# Patient Record
Sex: Male | Born: 1960 | Race: Black or African American | Hispanic: No | Smoking: Never smoker
Health system: Southern US, Community
[De-identification: ages and names within clinical notes are randomized; demographics above are authoritative.]

## PROBLEM LIST (undated history)

## (undated) DIAGNOSIS — I10 Essential (primary) hypertension: Secondary | ICD-10-CM

## (undated) DIAGNOSIS — F32A Depression, unspecified: Secondary | ICD-10-CM

## (undated) DIAGNOSIS — F329 Major depressive disorder, single episode, unspecified: Secondary | ICD-10-CM

## (undated) DIAGNOSIS — G4730 Sleep apnea, unspecified: Secondary | ICD-10-CM

## (undated) HISTORY — DX: Essential (primary) hypertension: I10

## (undated) HISTORY — DX: Depression, unspecified: F32.A

## (undated) HISTORY — DX: Major depressive disorder, single episode, unspecified: F32.9

---

## 1998-11-24 ENCOUNTER — Encounter: Payer: Self-pay | Admitting: Internal Medicine

## 1998-11-24 ENCOUNTER — Ambulatory Visit (HOSPITAL_COMMUNITY): Admission: RE | Admit: 1998-11-24 | Discharge: 1998-11-24 | Payer: Self-pay | Admitting: Internal Medicine

## 2001-12-08 ENCOUNTER — Emergency Department (HOSPITAL_COMMUNITY): Admission: EM | Admit: 2001-12-08 | Discharge: 2001-12-09 | Payer: Self-pay | Admitting: Emergency Medicine

## 2001-12-09 ENCOUNTER — Encounter: Payer: Self-pay | Admitting: Emergency Medicine

## 2004-03-26 ENCOUNTER — Emergency Department (HOSPITAL_COMMUNITY): Admission: EM | Admit: 2004-03-26 | Discharge: 2004-03-26 | Payer: Self-pay | Admitting: Emergency Medicine

## 2006-03-23 ENCOUNTER — Emergency Department (HOSPITAL_COMMUNITY): Admission: EM | Admit: 2006-03-23 | Discharge: 2006-03-23 | Payer: Self-pay | Admitting: Emergency Medicine

## 2009-12-20 ENCOUNTER — Emergency Department (HOSPITAL_COMMUNITY): Admission: EM | Admit: 2009-12-20 | Discharge: 2009-12-20 | Payer: Self-pay | Admitting: Emergency Medicine

## 2011-05-06 DIAGNOSIS — N529 Male erectile dysfunction, unspecified: Secondary | ICD-10-CM | POA: Diagnosis not present

## 2011-05-06 DIAGNOSIS — I10 Essential (primary) hypertension: Secondary | ICD-10-CM | POA: Diagnosis not present

## 2011-05-06 DIAGNOSIS — Z8249 Family history of ischemic heart disease and other diseases of the circulatory system: Secondary | ICD-10-CM | POA: Diagnosis not present

## 2011-05-06 DIAGNOSIS — Z79899 Other long term (current) drug therapy: Secondary | ICD-10-CM | POA: Diagnosis not present

## 2013-05-13 DIAGNOSIS — E559 Vitamin D deficiency, unspecified: Secondary | ICD-10-CM | POA: Diagnosis not present

## 2013-05-13 DIAGNOSIS — I10 Essential (primary) hypertension: Secondary | ICD-10-CM | POA: Diagnosis not present

## 2013-05-13 DIAGNOSIS — Z125 Encounter for screening for malignant neoplasm of prostate: Secondary | ICD-10-CM | POA: Diagnosis not present

## 2013-05-13 DIAGNOSIS — Z8249 Family history of ischemic heart disease and other diseases of the circulatory system: Secondary | ICD-10-CM | POA: Diagnosis not present

## 2013-05-13 DIAGNOSIS — N529 Male erectile dysfunction, unspecified: Secondary | ICD-10-CM | POA: Diagnosis not present

## 2013-05-13 DIAGNOSIS — Z0000 Encounter for general adult medical examination without abnormal findings: Secondary | ICD-10-CM | POA: Diagnosis not present

## 2013-05-13 DIAGNOSIS — R079 Chest pain, unspecified: Secondary | ICD-10-CM | POA: Diagnosis not present

## 2013-05-27 DIAGNOSIS — R0789 Other chest pain: Secondary | ICD-10-CM | POA: Diagnosis not present

## 2013-06-30 DIAGNOSIS — Z1211 Encounter for screening for malignant neoplasm of colon: Secondary | ICD-10-CM | POA: Diagnosis not present

## 2013-06-30 DIAGNOSIS — Z800 Family history of malignant neoplasm of digestive organs: Secondary | ICD-10-CM | POA: Diagnosis not present

## 2013-06-30 DIAGNOSIS — R079 Chest pain, unspecified: Secondary | ICD-10-CM | POA: Diagnosis not present

## 2013-07-07 DIAGNOSIS — I10 Essential (primary) hypertension: Secondary | ICD-10-CM | POA: Diagnosis not present

## 2013-07-07 DIAGNOSIS — R079 Chest pain, unspecified: Secondary | ICD-10-CM | POA: Diagnosis not present

## 2013-07-12 DIAGNOSIS — I10 Essential (primary) hypertension: Secondary | ICD-10-CM | POA: Diagnosis not present

## 2013-07-12 DIAGNOSIS — R079 Chest pain, unspecified: Secondary | ICD-10-CM | POA: Diagnosis not present

## 2013-08-05 DIAGNOSIS — R079 Chest pain, unspecified: Secondary | ICD-10-CM | POA: Diagnosis not present

## 2013-08-05 DIAGNOSIS — I10 Essential (primary) hypertension: Secondary | ICD-10-CM | POA: Diagnosis not present

## 2013-08-17 DIAGNOSIS — I10 Essential (primary) hypertension: Secondary | ICD-10-CM | POA: Diagnosis not present

## 2013-08-17 DIAGNOSIS — R079 Chest pain, unspecified: Secondary | ICD-10-CM | POA: Diagnosis not present

## 2013-09-08 ENCOUNTER — Ambulatory Visit (INDEPENDENT_AMBULATORY_CARE_PROVIDER_SITE_OTHER): Payer: Medicare Other | Admitting: Emergency Medicine

## 2013-09-08 VITALS — BP 128/92 | HR 91 | Temp 97.9°F | Resp 16 | Ht 68.0 in | Wt 173.4 lb

## 2013-09-08 DIAGNOSIS — H00019 Hordeolum externum unspecified eye, unspecified eyelid: Secondary | ICD-10-CM

## 2013-09-08 DIAGNOSIS — H00016 Hordeolum externum left eye, unspecified eyelid: Secondary | ICD-10-CM

## 2013-09-08 MED ORDER — TOBRAMYCIN 0.3 % OP OINT: 1.0000 "application " | TOPICAL_OINTMENT | Freq: Three times a day (TID) | OPHTHALMIC | Status: DC

## 2013-09-08 NOTE — Progress Notes (Signed)
   Subjective:    Patient ID: Allen Cole, male    DOB: 1960-03-17, 53 y.o.   MRN: [id number]  HPI 53 year old male presents to [facility] with a bump on left eyelid Left eye stye x 1 week No pain, the area on the upper lid appears pustule. He has a history of not like areas of the upper lid   Review of Systems     Objective:   Physical Exam tear 2 small pustules present on the midportion of the upper lid. A Q-tip was used and these were opened and. The material was expressed from the pustules. The eye itself appears normal.        Assessment & Plan:  Stye. We'll treat with warm compresses and antibiotic ointment

## 2013-09-08 NOTE — Patient Instructions (Signed)
Sty A sty (hordeolum) is an infection of a gland in the eyelid located at the base of the eyelash. A sty may develop a white or yellow head of pus. It can be puffy (swollen). Usually, the sty will burst and pus will come out on its own. They do not leave lumps in the eyelid once they drain. A sty is often confused with another form of cyst of the eyelid called a chalazion. Chalazions occur within the eyelid and not on the edge where the bases of the eyelashes are. They often are red, sore and then form firm lumps in the eyelid. CAUSES   Germs (bacteria).  Lasting (chronic) eyelid inflammation. SYMPTOMS   Tenderness, redness and swelling along the edge of the eyelid at the base of the eyelashes.  Sometimes, there is a white or yellow head of pus. It may or may not drain. DIAGNOSIS  An ophthalmologist will be able to distinguish between a sty and a chalazion and treat the condition appropriately.  TREATMENT   Styes are typically treated with warm packs (compresses) until drainage occurs.  In rare cases, medicines that kill germs (antibiotics) may be prescribed. These antibiotics may be in the form of drops, cream or pills.  If a hard lump has formed, it is generally necessary to do a small incision and remove the hardened contents of the cyst in a minor surgical procedure done in the office.  In suspicious cases, your caregiver may send the contents of the cyst to the lab to be certain that it is not a rare, but dangerous form of cancer of the glands of the eyelid. HOME CARE INSTRUCTIONS   Wash your hands often and dry them with a clean towel. Avoid touching your eyelid. This may spread the infection to other parts of the eye.  Apply heat to your eyelid for 10 to 20 minutes, several times a day, to ease pain and help to heal it faster.  Do not squeeze the sty. Allow it to drain on its own. Wash your eyelid carefully 3 to 4 times per day to remove any pus. SEEK IMMEDIATE MEDICAL CARE IF:    Your eye becomes painful or puffy (swollen).  Your vision changes.  Your sty does not drain by itself within 3 days.  Your sty comes back within a short period of time, even with treatment.  You have redness (inflammation) around the eye.  You have a fever. Document Released: 12/05/2004 Document Revised: 05/20/2011 Document Reviewed: 08/09/2008 [facility] Patient Information 2015 [place], [place]. This information is not intended to replace advice given to you by your health care provider. Make sure you discuss any questions you have with your health care provider.

## 2013-09-13 DIAGNOSIS — I10 Essential (primary) hypertension: Secondary | ICD-10-CM | POA: Diagnosis not present

## 2013-09-13 DIAGNOSIS — Z79899 Other long term (current) drug therapy: Secondary | ICD-10-CM | POA: Diagnosis not present

## 2013-10-05 DIAGNOSIS — K5730 Diverticulosis of large intestine without perforation or abscess without bleeding: Secondary | ICD-10-CM | POA: Diagnosis not present

## 2013-10-05 DIAGNOSIS — Z1211 Encounter for screening for malignant neoplasm of colon: Secondary | ICD-10-CM | POA: Diagnosis not present

## 2013-10-05 DIAGNOSIS — D126 Benign neoplasm of colon, unspecified: Secondary | ICD-10-CM | POA: Diagnosis not present

## 2013-10-23 ENCOUNTER — Ambulatory Visit (INDEPENDENT_AMBULATORY_CARE_PROVIDER_SITE_OTHER): Payer: Medicare Other

## 2013-10-23 ENCOUNTER — Ambulatory Visit (INDEPENDENT_AMBULATORY_CARE_PROVIDER_SITE_OTHER): Payer: Medicare Other | Admitting: Emergency Medicine

## 2013-10-23 VITALS — BP 164/94 | HR 79 | Temp 97.4°F | Resp 16 | Ht 68.5 in | Wt 177.4 lb

## 2013-10-23 DIAGNOSIS — M79609 Pain in unspecified limb: Secondary | ICD-10-CM

## 2013-10-23 DIAGNOSIS — M79642 Pain in left hand: Secondary | ICD-10-CM

## 2013-10-23 NOTE — Progress Notes (Addendum)
   Subjective:   This chart was scribed for Allen Cole A. Allen Farrier, MD  by Forrestine Him, [facility] Scribe. This patient was seen in room 4 and the patient's care was started 3:39 PM.     Patient ID: Allen Cole, male    DOB: October 27, 1960, 53 y.o.   MRN: [id number]  HPI  HPI Comments: [name] is a 53 y.o. male with a PMHx of HTN who presents to [facility] complaining of constant, moderate L hand pain x 1 week that is unchanged at this time. Pt states he noted the discomfort after falling back on his L hand while on a boat. Pt states currently discomfort is exacerbated with certain movement of the hand. No alleviating factors at this time. He is unable to fully flex or extend his L 3rd, 4th, and 5th fingers. He denies any previous injury to the hand. At this time he denies any fever or chills. No numbness, loss of sensation, or paresthesia. No known allergies to medications. No other concerns this visit.  There are no active problems to display for this patient.  Past Medical History  Diagnosis Date  . Depression   . Hypertension    History reviewed. No pertinent past surgical history. No Known Allergies Prior to Admission medications   Medication Sig Start Date End Date Taking? Authorizing Provider  ibuprofen (ADVIL,MOTRIN) 200 MG tablet Take 200 mg by mouth as needed.   Yes Historical Provider, MD  olmesartan-hydrochlorothiazide (BENICAR HCT) 20-12.5 MG per tablet Take 1 tablet by mouth daily.   Yes Historical Provider, MD    Review of Systems  Constitutional: Negative for fever and chills.  Musculoskeletal: Positive for arthralgias and joint swelling.    Triage Vitals: BP 164/94  Pulse 79  Temp(Src) 97.4 F (36.3 C) (Oral)  Resp 16  Ht 5' 8.5" (1.74 m)  Wt 177 lb 6.4 oz (80.468 kg)  BMI 26.58 kg/m2  SpO2 97%   Objective:  Physical Exam  Nursing note and vitals reviewed. Constitutional: He is oriented to person, place, and  time. He appears well-developed and well-nourished.  HENT:  Head: Normocephalic.  Eyes: EOM are normal.  Neck: Normal range of motion.  Pulmonary/Chest: Effort normal.  Abdominal: He exhibits no distension.  Musculoskeletal: Normal range of motion. He exhibits edema and tenderness.  Tenderness to palpation over distal 4th metacarpal Swelling over the 3rd, 4th, and 5th metacarpals Not able to fully flex 3rd, 4th, and 5th fingers  Neurological: He is alert and oriented to person, place, and time.  Psychiatric: He has a normal mood and affect.  [facility] reading (PRIMARY) by  Dr.Piedad Standiford no definite fracture seen.   Assessment & Plan:  Radiologist also did not see a fracture. We'll treat with buddy taping of the third and fourth fingers together. I advised the patient if he continued to have symptoms to recheck in one week. He will keep the third  finger splinted to the fourth and I personally performed the services described in this documentation, which was scribed in my presence. The recorded information has been reviewed and is accurate.

## 2013-12-21 ENCOUNTER — Telehealth: Payer: Self-pay

## 2013-12-21 NOTE — Telephone Encounter (Signed)
Request printed and forwarded to x-ray. °

## 2013-12-21 NOTE — Telephone Encounter (Signed)
Patient called requesting x-rays of left hand for upcoming ortho appt on Thursday. Please call when ready at [phone]

## 2013-12-24 DIAGNOSIS — S6392XA Sprain of unspecified part of left wrist and hand, initial encounter: Secondary | ICD-10-CM | POA: Diagnosis not present

## 2013-12-30 DIAGNOSIS — M19042 Primary osteoarthritis, left hand: Secondary | ICD-10-CM | POA: Diagnosis not present

## 2014-06-13 ENCOUNTER — Ambulatory Visit (INDEPENDENT_AMBULATORY_CARE_PROVIDER_SITE_OTHER): Payer: Medicare Other | Admitting: Emergency Medicine

## 2014-06-13 ENCOUNTER — Ambulatory Visit (INDEPENDENT_AMBULATORY_CARE_PROVIDER_SITE_OTHER): Payer: Medicare Other

## 2014-06-13 VITALS — BP 154/86 | HR 105 | Temp 98.1°F | Resp 16 | Ht 68.0 in | Wt 171.0 lb

## 2014-06-13 DIAGNOSIS — J209 Acute bronchitis, unspecified: Secondary | ICD-10-CM

## 2014-06-13 DIAGNOSIS — R05 Cough: Secondary | ICD-10-CM | POA: Diagnosis not present

## 2014-06-13 DIAGNOSIS — J0140 Acute pansinusitis, unspecified: Secondary | ICD-10-CM | POA: Diagnosis not present

## 2014-06-13 DIAGNOSIS — R059 Cough, unspecified: Secondary | ICD-10-CM

## 2014-06-13 MED ORDER — PSEUDOEPHEDRINE-GUAIFENESIN ER 60-600 MG PO TB12: 1.0000 | ORAL_TABLET | Freq: Two times a day (BID) | ORAL | Status: AC

## 2014-06-13 MED ORDER — AMOXICILLIN-POT CLAVULANATE 875-125 MG PO TABS: 1.0000 | ORAL_TABLET | Freq: Two times a day (BID) | ORAL | Status: DC

## 2014-06-13 MED ORDER — HYDROCOD POLST-CHLORPHEN POLST 10-8 MG/5ML PO LQCR: 5.0000 mL | Freq: Two times a day (BID) | ORAL | Status: DC | PRN

## 2014-06-13 NOTE — Patient Instructions (Signed)

## 2014-06-13 NOTE — Progress Notes (Signed)
[facility] [address] [place] [address] [phone]  Date:  06/13/2014   Name:  Allen Cole   DOB:  06-28-1960   MRN:  [id number]  PCP:  No PCP Per Patient    Chief Complaint: Cough   History of Present Illness:  Allen Cole is a 54 y.o. very pleasant male patient who presents with the following:  Ill for two weeks,  Initially with nasal congestion and drainage.   Cough productive of purulent sputum.  Now blood tinged Non smoker No wheezing or shortness of breath No fever or chills. No malaise or fatigue Retired Non smoker No improvement with over the counter medications or other home remedies. Denies other complaint or health concern today.   There are no active problems to display for this patient.   Past Medical History  Diagnosis Date  . Depression   . Hypertension     History reviewed. No pertinent past surgical history.  History  Substance Use Topics  . Smoking status: Never Smoker   . Smokeless tobacco: Not on file  . Alcohol Use: Yes    Family History  Problem Relation Age of Onset  . Diabetes Mother   . Diabetes Maternal Grandmother   . Hyperlipidemia Maternal Grandfather   . Stroke Maternal Grandfather     No Known Allergies  Medication list has been reviewed and updated.  Current Outpatient Prescriptions on File Prior to Visit  Medication Sig Dispense Refill  . ibuprofen (ADVIL,MOTRIN) 200 MG tablet Take 200 mg by mouth as needed.    Marland Kitchen olmesartan-hydrochlorothiazide (BENICAR HCT) 20-12.5 MG per tablet Take 1 tablet by mouth daily.     No current facility-administered medications on file prior to visit.    Review of Systems:  As per HPI, otherwise negative.    Physical Examination: Filed Vitals:   06/13/14 1553  BP: 154/86  Pulse: 105  Temp: 98.1 F (36.7 C)  Resp: 16   Filed Vitals:   06/13/14 1553  Height: 5\' 8"  (1.727 m)  Weight: 171 lb (77.565 kg)   Body mass index is 26.01  kg/(m^2). Ideal Body Weight: Weight in (lb) to have BMI = 25: 164.1  GEN: WDWN, NAD, Non-toxic, A & O x 3 HEENT: Atraumatic, Normocephalic. Neck supple. No masses, No LAD. Ears and Nose: No external deformity. CV: RRR, No M/G/R. No JVD. No thrill. No extra heart sounds. PULM: CTA B, no wheezes, crackles, rhonchi. No retractions. No resp. distress. No accessory muscle use. ABD: S, NT, ND, +BS. No rebound. No HSM. EXTR: No c/c/e NEURO Normal gait.  PSYCH: Normally interactive. Conversant. Not depressed or anxious appearing.  Calm demeanor.    Assessment and Plan: Bronchitis Sinusitis augmentin mucinex tusionex  Signed,  Ellison Carwin, MD   [facility] reading (PRIMARY) by  Dr. Ouida Sills.  Negative .

## 2015-10-09 ENCOUNTER — Emergency Department (HOSPITAL_COMMUNITY): Payer: Medicare Other

## 2015-10-09 ENCOUNTER — Encounter (HOSPITAL_COMMUNITY): Payer: Self-pay | Admitting: Emergency Medicine

## 2015-10-09 ENCOUNTER — Emergency Department (HOSPITAL_COMMUNITY)
Admission: EM | Admit: 2015-10-09 | Discharge: 2015-10-10 | Disposition: A | Discharge status: Home / Self Care | Payer: Medicare Other | Attending: Emergency Medicine | Admitting: Emergency Medicine

## 2015-10-09 DIAGNOSIS — Z791 Long term (current) use of non-steroidal anti-inflammatories (NSAID): Secondary | ICD-10-CM | POA: Diagnosis not present

## 2015-10-09 DIAGNOSIS — I10 Essential (primary) hypertension: Secondary | ICD-10-CM | POA: Insufficient documentation

## 2015-10-09 DIAGNOSIS — R079 Chest pain, unspecified: Secondary | ICD-10-CM

## 2015-10-09 DIAGNOSIS — R0789 Other chest pain: Secondary | ICD-10-CM | POA: Insufficient documentation

## 2015-10-09 DIAGNOSIS — M25512 Pain in left shoulder: Secondary | ICD-10-CM | POA: Insufficient documentation

## 2015-10-09 LAB — CBC
HEMATOCRIT: 39 % (ref 39.0–52.0)
Hemoglobin: 13.6 g/dL (ref 13.0–17.0)
MCH: 30.9 pg (ref 26.0–34.0)
MCHC: 34.9 g/dL (ref 30.0–36.0)
MCV: 88.6 fL (ref 78.0–100.0)
PLATELETS: 183 10*3/uL (ref 150–400)
RBC: 4.4 MIL/uL (ref 4.22–5.81)
RDW: 12.4 % (ref 11.5–15.5)
WBC: 4.8 10*3/uL (ref 4.0–10.5)

## 2015-10-09 LAB — BASIC METABOLIC PANEL
Anion gap: 8 (ref 5–15)
BUN: 8 mg/dL (ref 6–20)
CALCIUM: 9.1 mg/dL (ref 8.9–10.3)
CO2: 24 mmol/L (ref 22–32)
Chloride: 104 mmol/L (ref 101–111)
Creatinine, Ser: 0.78 mg/dL (ref 0.61–1.24)
GFR calc Af Amer: >60 mL/min (ref >60)
GLUCOSE: 121 mg/dL — AB (ref 65–99)
POTASSIUM: 3.5 mmol/L (ref 3.5–5.1)
SODIUM: 136 mmol/L (ref 135–145)

## 2015-10-09 LAB — I-STAT TROPONIN, ED: TROPONIN I, POC: 0 ng/mL (ref 0.00–0.08)

## 2015-10-09 LAB — TROPONIN I

## 2015-10-09 NOTE — ED Triage Notes (Signed)
Pt reports L shoulder/neck pain for the past 2 weeks that radiates into chest. No known injuries or neck problems. No SOB, dizziness or lightheadedness. Pain sometimes radiates down arm.

## 2015-10-09 NOTE — ED Provider Notes (Signed)
Emergency Department Provider Note   I have reviewed the triage vital signs and the nursing notes.   HISTORY  Chief Complaint Shoulder Pain and Chest Pain   HPI Allen Cole is a 55 y.o. male with PMH of depression and HTN presents to the emergency department for evaluation of left shoulder and chest discomfort with some discomfort down his left arm. The patient states that symptoms been ongoing for 2 weeks. He reports significantly worsening symptoms with turning his head to the left or lifting his arm. He feels an intense pain radiating from his left shoulder. He reports some occasional tingling in his fingertips but no obvious numbness or weakness. He occasionally has some left anterior chest pain. Patient reports undergoing Cardiolite stress test in the distant past for some chest pain but this feels different. He has tried Tylenol, Motrin without significant relief in symptoms and the patient is having difficulty sleeping.    Past Medical History:  Diagnosis Date  . Depression   . Hypertension     There are no active problems to display for this patient.   History reviewed. No pertinent surgical history.  Current Outpatient Rx  . Order #: [id number] Class: Historical Med  . Order #: [id number] Class: Historical Med  . Order #: [id number] Class: Normal  . Order #: [id number] Class: Print  . Order #: [id number] Class: Print  . Order #: [id number] Class: Historical Med  . Order #: [id number] Class: Print    Allergies Review of patient's allergies indicates no known allergies.  Family History  Problem Relation Age of Onset  . Diabetes Mother   . Diabetes Maternal Grandmother   . Hyperlipidemia Maternal Grandfather   . Stroke Maternal Grandfather     Social History Social History  Substance Use Topics  . Smoking status: Never Smoker  . Smokeless tobacco: Not on file  . Alcohol use Yes    Review of Systems  Constitutional: No fever/chills Eyes: No visual changes. ENT: No sore  throat. Cardiovascular: Positive chest pain. Respiratory: Denies shortness of breath. Gastrointestinal: No abdominal pain.  No nausea, no vomiting.  No diarrhea.  No constipation. Genitourinary: Negative for dysuria. Musculoskeletal: Negative for back pain. Positive left shoulder pain.  Skin: Negative for rash. Neurological: Negative for headaches, focal weakness or numbness.  10-point ROS otherwise negative.  ____________________________________________   PHYSICAL EXAM:  VITAL SIGNS: ED Triage Vitals  Enc Vitals Group     BP 10/09/15 1835 158/99     Pulse Rate 10/09/15 1835 90     Resp 10/09/15 1835 21     Temp 10/09/15 1835 97.9 F (36.6 C)     Temp Source 10/09/15 1835 Oral     SpO2 10/09/15 1835 96 %     Pain Score 10/09/15 1839 7   Constitutional: Alert and oriented. Well appearing and in no acute distress. Eyes: Conjunctivae are normal. PERRL. EOMI. Head: Atraumatic. Nose: No congestion/rhinnorhea. Mouth/Throat: Mucous membranes are moist.  Oropharynx non-erythematous. Neck: No stridor.  Cardiovascular: Normal rate, regular rhythm. Good peripheral circulation. Grossly normal heart sounds.   Respiratory: Normal respiratory effort.  No retractions. Lungs CTAB. Gastrointestinal: Soft and nontender. No distention.  Musculoskeletal: Tenderness to palpation along the trapezius and deltoid on the left. Full active and passive ROM of the left shoulder with some pain on this movement.  Neurologic:  Normal speech and language. No gross focal neurologic deficits are appreciated.  Skin:  Skin is warm, dry and intact. No rash noted. Psychiatric: Mood and affect are normal. Speech and behavior  are normal.  ____________________________________________   LABS (all labs ordered are listed, but only abnormal results are displayed)  Labs Reviewed  BASIC METABOLIC PANEL - Abnormal; Notable for the following:       Result Value   Glucose, Bld 121 (*)    All other components within  normal limits  CBC  TROPONIN I  I-STAT TROPOININ, ED  I-STAT TROPOININ, ED   ____________________________________________  EKG  Reviewed. No STEMI.  ____________________________________________  RADIOLOGY  Dg Chest 2 View  Result Date: 10/09/2015 CLINICAL DATA:  Severe chest pain and left shoulder pain. No history of injury. EXAM: CHEST  2 VIEW COMPARISON:  06/13/2014 FINDINGS: Normal heart size. Lungs clear. No pneumothorax. No pleural effusion. IMPRESSION: No active cardiopulmonary disease. Electronically Signed   By: Marybelle Killings M.D.   On: 10/09/2015 19:13   Dg Shoulder Left  Result Date: 10/09/2015 CLINICAL DATA:  Severe Chest pain and left shoulder pain without injury x 2 weeks. HTN controlled by meds no other cardiac hx EXAM: LEFT SHOULDER - 2+ VIEW COMPARISON:  None. FINDINGS: There is no evidence of fracture or dislocation. There is no evidence of arthropathy or other focal bone abnormality. Soft tissues are unremarkable. IMPRESSION: Negative. Electronically Signed   By: Nolon Nations M.D.   On: 10/09/2015 19:13    ____________________________________________   PROCEDURES  Procedure(s) performed:   Procedures  None ____________________________________________   INITIAL IMPRESSION / ASSESSMENT AND PLAN / ED COURSE  Pertinent labs & imaging results that were available during my care of the patient were reviewed by me and considered in my medical decision making (see chart for details).  Patient presents to the emergency department for evaluation of left shoulder, chest, arm pain. Patient describes it as very positional and movement dependent. He is able to reproduce the pain with turning his neck or lifting his arm. He reports he does have some discomfort with rest. His symptoms sound radicular in nature with the tingling in his left fingertips. No gross neurological deficits found on my exam. Patient has no tenderness to palpation of the cervical spine with full  range of motion there. No recent history of cervical spine injury. Plan for repeat troponin and pain control. Discussed that he will require outpatient management with his primary care physician to decide on further imaging of the spine or shoulder as deemed necessary at that time. I do not see an indication for emergent imaging at this time.   12:31 PM Second troponin is negative. Plan for discharge with PCP and Orthopedic follow up as needed. Gave Tramadol and Lidoderm patches for symptomatic relief.   At this time, I do not feel there is any life-threatening condition present. I have reviewed and discussed all results (EKG, imaging, lab, urine as appropriate), exam findings with patient. I have reviewed nursing notes and appropriate previous records.  I feel the patient is safe to be discharged home without further emergent workup. Discussed usual and customary return precautions. Patient and family (if present) verbalize understanding and are comfortable with this plan.  Patient will follow-up with their primary care provider. If they do not have a primary care provider, information for follow-up has been provided to them. All questions have been answered.  ____________________________________________  FINAL CLINICAL IMPRESSION(S) / ED DIAGNOSES  Final diagnoses:  Shoulder pain, acute, left  Chest pain, unspecified chest pain type     MEDICATIONS GIVEN DURING THIS VISIT:  Medications  traMADol (ULTRAM) tablet 50 mg (50 mg Oral Given 10/10/15 0137)  NEW OUTPATIENT MEDICATIONS STARTED DURING THIS VISIT:  Discharge Medication List as of 10/10/2015 12:31 AM    START taking these medications   Details  lidocaine (LIDODERM) 5 % Place 1 patch onto the skin daily. Remove & Discard patch within 12 hours or as directed by MD, Starting Tue 10/10/2015, Print    traMADol (ULTRAM) 50 MG tablet Take 1 tablet (50 mg total) by mouth every 6 (six) hours as needed., Starting Tue 10/10/2015, Print            Note:  This document was prepared using Dragon voice recognition software and may include unintentional dictation errors.  Nanda Quinton, MD Emergency Medicine   Margette Fast, MD 10/10/15 1130

## 2015-10-09 NOTE — ED Notes (Addendum)
Pt stated "the pain feels like cramping and then it becomes sharp.  It goes up into the left side of my neck & into my left shoulder blade  It depends on the way I turn."  Pt confirmed pain is reproducible.  Pt denies n/v/diaphoresis.  Pt also stated "I have been doing some work on a [employer]."  By pt's admission, does not take b/p meds as prescribed.  Pt placed on monitor.

## 2015-10-10 DIAGNOSIS — R0789 Other chest pain: Secondary | ICD-10-CM | POA: Diagnosis not present

## 2015-10-10 MED ORDER — TRAMADOL HCL 50 MG PO TABS: 50.0000 mg | ORAL_TABLET | Freq: Four times a day (QID) | ORAL | 0 refills | Status: DC | PRN

## 2015-10-10 MED ORDER — LIDOCAINE 5 % EX PTCH: 1.0000 | MEDICATED_PATCH | CUTANEOUS | 0 refills | Status: DC

## 2015-10-10 MED ORDER — TRAMADOL HCL 50 MG PO TABS
50.0000 mg | ORAL_TABLET | Freq: Once | ORAL | Status: AC
  Administered 2015-10-10: 50 mg via ORAL
  Filled 2015-10-10: qty 1

## 2015-10-10 NOTE — Discharge Instructions (Signed)

## 2015-10-20 DIAGNOSIS — M5412 Radiculopathy, cervical region: Secondary | ICD-10-CM | POA: Diagnosis not present

## 2015-10-26 DIAGNOSIS — M542 Cervicalgia: Secondary | ICD-10-CM | POA: Diagnosis not present

## 2015-10-30 DIAGNOSIS — M5412 Radiculopathy, cervical region: Secondary | ICD-10-CM | POA: Diagnosis not present

## 2015-11-02 DIAGNOSIS — M5412 Radiculopathy, cervical region: Secondary | ICD-10-CM | POA: Diagnosis not present

## 2015-11-02 DIAGNOSIS — M542 Cervicalgia: Secondary | ICD-10-CM | POA: Diagnosis not present

## 2015-11-06 DIAGNOSIS — M542 Cervicalgia: Secondary | ICD-10-CM | POA: Diagnosis not present

## 2015-11-06 DIAGNOSIS — M5412 Radiculopathy, cervical region: Secondary | ICD-10-CM | POA: Diagnosis not present

## 2015-11-08 DIAGNOSIS — M542 Cervicalgia: Secondary | ICD-10-CM | POA: Diagnosis not present

## 2015-11-08 DIAGNOSIS — M5412 Radiculopathy, cervical region: Secondary | ICD-10-CM | POA: Diagnosis not present

## 2015-11-16 DIAGNOSIS — M542 Cervicalgia: Secondary | ICD-10-CM | POA: Diagnosis not present

## 2015-11-16 DIAGNOSIS — M5412 Radiculopathy, cervical region: Secondary | ICD-10-CM | POA: Diagnosis not present

## 2015-11-17 DIAGNOSIS — M5412 Radiculopathy, cervical region: Secondary | ICD-10-CM | POA: Diagnosis not present

## 2015-11-17 DIAGNOSIS — M542 Cervicalgia: Secondary | ICD-10-CM | POA: Diagnosis not present

## 2015-11-20 DIAGNOSIS — M5412 Radiculopathy, cervical region: Secondary | ICD-10-CM | POA: Diagnosis not present

## 2015-11-20 DIAGNOSIS — M542 Cervicalgia: Secondary | ICD-10-CM | POA: Diagnosis not present

## 2015-11-28 DIAGNOSIS — M5412 Radiculopathy, cervical region: Secondary | ICD-10-CM | POA: Diagnosis not present

## 2015-12-18 DIAGNOSIS — M5412 Radiculopathy, cervical region: Secondary | ICD-10-CM | POA: Diagnosis not present

## 2016-03-14 ENCOUNTER — Ambulatory Visit (INDEPENDENT_AMBULATORY_CARE_PROVIDER_SITE_OTHER): Payer: Medicare Other | Admitting: Family Medicine

## 2016-03-14 ENCOUNTER — Ambulatory Visit: Payer: Medicare Other

## 2016-03-14 ENCOUNTER — Ambulatory Visit (INDEPENDENT_AMBULATORY_CARE_PROVIDER_SITE_OTHER): Payer: Medicare Other

## 2016-03-14 VITALS — BP 134/82 | HR 82 | Temp 97.9°F | Resp 18 | Ht 68.0 in | Wt 179.0 lb

## 2016-03-14 DIAGNOSIS — M25511 Pain in right shoulder: Secondary | ICD-10-CM | POA: Diagnosis not present

## 2016-03-14 DIAGNOSIS — S6991XA Unspecified injury of right wrist, hand and finger(s), initial encounter: Secondary | ICD-10-CM | POA: Diagnosis not present

## 2016-03-14 DIAGNOSIS — S4991XA Unspecified injury of right shoulder and upper arm, initial encounter: Secondary | ICD-10-CM | POA: Diagnosis not present

## 2016-03-14 DIAGNOSIS — M25531 Pain in right wrist: Secondary | ICD-10-CM | POA: Diagnosis not present

## 2016-03-14 DIAGNOSIS — S62111A Displaced fracture of triquetrum [cuneiform] bone, right wrist, initial encounter for closed fracture: Secondary | ICD-10-CM

## 2016-03-14 DIAGNOSIS — M25532 Pain in left wrist: Secondary | ICD-10-CM

## 2016-03-14 DIAGNOSIS — M7989 Other specified soft tissue disorders: Secondary | ICD-10-CM | POA: Diagnosis not present

## 2016-03-14 DIAGNOSIS — M25512 Pain in left shoulder: Secondary | ICD-10-CM

## 2016-03-14 MED ORDER — HYDROCODONE-ACETAMINOPHEN 5-325 MG PO TABS: 1.0000 | ORAL_TABLET | Freq: Four times a day (QID) | ORAL | 0 refills | Status: DC | PRN

## 2016-03-14 NOTE — Patient Instructions (Addendum)
Take the Norco for pain relief when you need it.  You can take this every 6 hours if so.    We are referring you to orthopedics for your shoulder and wrist.  They will remove the cast and reimage your wrist most likely.   If you have any questions, don't hesitate to reach out to [place].      IF you received an x-ray today, you will receive an invoice from [facility]. Please contact [facility] at [phone] with questions or concerns regarding your invoice.   IF you received labwork today, you will receive an invoice from [facility]. Please contact [facility] at [phone] with questions or concerns regarding your invoice.   Our billing staff will not be able to assist you with questions regarding bills from these companies.  You will be contacted with the lab results as soon as they are available. The fastest way to get your results is to activate your My Chart account. Instructions are located on the last page of this paperwork. If you have not heard from [place] regarding the results in 2 weeks, please contact this office.

## 2016-03-14 NOTE — Progress Notes (Signed)
Allen Cole is a 55 y.o. male who presents to [facility] today for fall:  1.  Fall:  Patient was out last night helping stuck cars in the ice when he slipped on the ice.  Landed on outstretched Right hand.  Had immediate pain in wrist and shoulder.  Pain has continued through today.  Did not go to work.  Has been taking Advil with minimal relief.  Unable to raise arm laterally or to front due to pain.  No numbness or tingling in arm or hand.  Eating and drinking well.  Did not hit head. No loss of consciousness. No headaches.  ROS as above.    PMH reviewed. Patient is a nonsmoker.   Past Medical History:  Diagnosis Date  . Depression   . Hypertension    History reviewed. No pertinent surgical history.  Medications reviewed. Current Outpatient Prescriptions  Medication Sig Dispense Refill  . olmesartan-hydrochlorothiazide (BENICAR HCT) 20-12.5 MG per tablet Take 1 tablet by mouth daily.    Marland Kitchen acetaminophen (TYLENOL) 500 MG tablet Take 1,000 mg by mouth every 6 (six) hours as needed for moderate pain.    Marland Kitchen amoxicillin-clavulanate (AUGMENTIN) 875-125 MG per tablet Take 1 tablet by mouth 2 (two) times daily. (Patient not taking: Reported on 03/14/2016) 20 tablet 0  . chlorpheniramine-HYDROcodone (TUSSIONEX PENNKINETIC ER) 10-8 MG/5ML LQCR Take 5 mLs by mouth every 12 (twelve) hours as needed. (Patient not taking: Reported on 03/14/2016) 60 mL 0  . ibuprofen (ADVIL,MOTRIN) 200 MG tablet Take 400 mg by mouth every 6 (six) hours as needed for moderate pain.     Marland Kitchen lidocaine (LIDODERM) 5 % Place 1 patch onto the skin daily. Remove & Discard patch within 12 hours or as directed by MD (Patient not taking: Reported on 03/14/2016) 30 patch 0  . traMADol (ULTRAM) 50 MG tablet Take 1 tablet (50 mg total) by mouth every 6 (six) hours as needed. (Patient not taking: Reported on 03/14/2016) 15 tablet 0   No current facility-administered medications for this visit.      Physical Exam:    BP 134/82 (BP Location: Right Arm, Patient Position: Sitting, Cuff Size: Normal)   Pulse 82   Temp 97.9 F (36.6 C) (Oral)   Resp 18   Ht 5\' 8"  (1.727 m)   Wt 179 lb (81.2 kg)   SpO2 93%   BMI 27.22 kg/m  Gen:  Alert, cooperative patient who appears stated age in no acute distress.  Vital signs reviewed. MSK:  Left shoulder/arm WNL.  Right shoulder:  Inability to actively raise his arm either forward or lateral abduction past about 25 secondary to pain. I was able to passively raise at about 45 before he was not able tolerate any further secondary to pain. This is both forward and laterally. Tender directly over his right anterior aspect of the shoulder in general region. Nontender posterior aspect of his right shoulder. Right wrist:  TTP directly over lateral aspect of dorsum of right wrist. Handgrip strength is 2 out of 5 and limited by pain As noted below he had full sensation throughout his hand. Skin: No bruising at hand or right shoulder CV:  Distal pulses intact BL +2 Neuro:  Sensation intact RIght hand all 5 fingers and throughout hand/arm in all nerve distributions.   Assessment and Plan:  1.  Right triquetrum fracture:  - volar splint placed here in clinic by PA - referral to a for this. He is artery a  patient at different orthopedics will call them in the morning. I have put a referral in as well.  #2. Right shoulder pain: -Seems most likely to be a possible rotator cuff tear. Especially as he is really unable to actively abduct his arm. -X-rays were negative for any dislocation. -As above follow-up with orthopedic. He was placed in a sling before leaving our clinic.

## 2016-03-18 ENCOUNTER — Ambulatory Visit (INDEPENDENT_AMBULATORY_CARE_PROVIDER_SITE_OTHER): Payer: Medicare Other | Admitting: Orthopaedic Surgery

## 2016-03-18 DIAGNOSIS — M25511 Pain in right shoulder: Secondary | ICD-10-CM | POA: Diagnosis not present

## 2016-03-18 DIAGNOSIS — M25531 Pain in right wrist: Secondary | ICD-10-CM | POA: Diagnosis not present

## 2016-04-03 DIAGNOSIS — M67911 Unspecified disorder of synovium and tendon, right shoulder: Secondary | ICD-10-CM | POA: Diagnosis not present

## 2016-04-08 DIAGNOSIS — M25511 Pain in right shoulder: Secondary | ICD-10-CM | POA: Diagnosis not present

## 2016-04-10 DIAGNOSIS — M75121 Complete rotator cuff tear or rupture of right shoulder, not specified as traumatic: Secondary | ICD-10-CM | POA: Diagnosis not present

## 2016-04-23 DIAGNOSIS — Y999 Unspecified external cause status: Secondary | ICD-10-CM | POA: Diagnosis not present

## 2016-04-23 DIAGNOSIS — S46011D Strain of muscle(s) and tendon(s) of the rotator cuff of right shoulder, subsequent encounter: Secondary | ICD-10-CM | POA: Diagnosis not present

## 2016-04-23 DIAGNOSIS — M24111 Other articular cartilage disorders, right shoulder: Secondary | ICD-10-CM | POA: Diagnosis not present

## 2016-04-23 DIAGNOSIS — M19011 Primary osteoarthritis, right shoulder: Secondary | ICD-10-CM | POA: Diagnosis not present

## 2016-04-23 DIAGNOSIS — M7541 Impingement syndrome of right shoulder: Secondary | ICD-10-CM | POA: Diagnosis not present

## 2016-04-23 DIAGNOSIS — G8918 Other acute postprocedural pain: Secondary | ICD-10-CM | POA: Diagnosis not present

## 2016-04-23 DIAGNOSIS — S46011A Strain of muscle(s) and tendon(s) of the rotator cuff of right shoulder, initial encounter: Secondary | ICD-10-CM | POA: Diagnosis not present

## 2016-04-23 DIAGNOSIS — M7542 Impingement syndrome of left shoulder: Secondary | ICD-10-CM | POA: Diagnosis not present

## 2016-05-01 DIAGNOSIS — M19011 Primary osteoarthritis, right shoulder: Secondary | ICD-10-CM | POA: Diagnosis not present

## 2016-05-06 DIAGNOSIS — M25511 Pain in right shoulder: Secondary | ICD-10-CM | POA: Diagnosis not present

## 2016-05-06 DIAGNOSIS — M75121 Complete rotator cuff tear or rupture of right shoulder, not specified as traumatic: Secondary | ICD-10-CM | POA: Diagnosis not present

## 2016-05-10 DIAGNOSIS — M25511 Pain in right shoulder: Secondary | ICD-10-CM | POA: Diagnosis not present

## 2016-05-10 DIAGNOSIS — M75121 Complete rotator cuff tear or rupture of right shoulder, not specified as traumatic: Secondary | ICD-10-CM | POA: Diagnosis not present

## 2016-05-14 DIAGNOSIS — M75121 Complete rotator cuff tear or rupture of right shoulder, not specified as traumatic: Secondary | ICD-10-CM | POA: Diagnosis not present

## 2016-05-14 DIAGNOSIS — M25511 Pain in right shoulder: Secondary | ICD-10-CM | POA: Diagnosis not present

## 2016-05-16 DIAGNOSIS — M75121 Complete rotator cuff tear or rupture of right shoulder, not specified as traumatic: Secondary | ICD-10-CM | POA: Diagnosis not present

## 2016-05-16 DIAGNOSIS — M25511 Pain in right shoulder: Secondary | ICD-10-CM | POA: Diagnosis not present

## 2016-05-23 ENCOUNTER — Ambulatory Visit (INDEPENDENT_AMBULATORY_CARE_PROVIDER_SITE_OTHER): Payer: Medicare Other | Admitting: Urgent Care

## 2016-05-23 VITALS — BP 132/90 | HR 87 | Temp 98.1°F | Resp 18 | Ht 68.0 in | Wt 178.8 lb

## 2016-05-23 DIAGNOSIS — M25511 Pain in right shoulder: Secondary | ICD-10-CM | POA: Diagnosis not present

## 2016-05-23 DIAGNOSIS — L03213 Periorbital cellulitis: Secondary | ICD-10-CM

## 2016-05-23 DIAGNOSIS — M75121 Complete rotator cuff tear or rupture of right shoulder, not specified as traumatic: Secondary | ICD-10-CM | POA: Diagnosis not present

## 2016-05-23 MED ORDER — CEFDINIR 300 MG PO CAPS: 600.0000 mg | ORAL_CAPSULE | Freq: Every day | ORAL | 0 refills | Status: AC

## 2016-05-23 NOTE — Progress Notes (Signed)
  MRN: [id number] DOB: 04/13/1960  Subjective:   Allen Cole is a 56 y.o. male presenting for chief complaint of Eye Problem (x3 weeks; painful and swollen; drains clear fluid off/on)  Reports 3 week history of right eye pain, swelling, irritation. Symptoms started out with itching, swelling (at its worst today). He has used warm compresses occasionally. Denies fever, eye pain with movement or light. Denies eye drainage, redness, matted eye. Denies fever, sinus congestion, sinus pain, headache, ear pain, ear drainage. He has previously had similar symptoms in the past for his left eye, resolved with tobramycin eye drops. Denies using contacts or glasses.  Allen Cole has a current medication list which includes the following prescription(s): olmesartan-hydrochlorothiazide and tramadol. Also has No Known Allergies.  Allen Cole  has a past medical history of Depression and Hypertension. Also reports right shoulder surgery.  Objective:   Vitals: BP 132/90   Pulse 87   Temp 98.1 F (36.7 C) (Oral)   Resp 18   Ht 5\' 8"  (1.727 m)   Wt 178 lb 12.8 oz (81.1 kg)   SpO2 96%   BMI 27.19 kg/m    Visual Acuity Screening   Right eye Left eye Both eyes  Without correction: 20/30 20/25 20/25   With correction:      Physical Exam  Constitutional: He is oriented to person, place, and time. He appears well-developed and well-nourished.  Eyes: EOM are normal. Pupils are equal, round, and reactive to light. Right eye exhibits no chemosis, no discharge, no exudate and no hordeolum. No foreign body present in the right eye. Left eye exhibits no discharge. Right conjunctiva is not injected. Right conjunctiva has no hemorrhage. Left conjunctiva is not injected. Left conjunctiva has no hemorrhage. No scleral icterus.    Cardiovascular: Normal rate.   Pulmonary/Chest: Effort normal.  Neurological: He is alert and oriented to person, place, and time.   Eye Exam: Eyelids everted and swept for foreign  body. The eye was stained with fluorescein. Examination under woods lamp does not reveal a foreign body or area of increased stain uptake. The eye was then irrigated copiously with saline.   Assessment and Plan :   1. Preseptal cellulitis of right upper eyelid - Will use cefdinir 300mg  twice daily. Use warm compresses. RTC in 2 days for recheck. Symptoms and physical exam findings reassuring against orbital cellulitis.  Jaynee Eagles, PA-C [facility] [phone] 05/23/2016  5:06 PM

## 2016-05-23 NOTE — Patient Instructions (Addendum)
Preseptal Cellulitis, Adult Preseptal cellulitis-also called periorbital cellulitis-is an infection that can affect your eyelid and the soft tissues or skin that surround your eye. The infection may also affect the structures that produce and drain your tears. It does not affect your eye itself. What are the causes? This condition may be caused by:  Bacterial infection.  Long-term (chronic) sinus infections.  An object (foreign body) that is stuck behind the eye.  An injury that:  Goes through the eyelid tissues.  Causes an infection, such as an insect sting.  Fracture of the bone around the eye.  Infections that have spread from the eyelid or other structures around the eye.  Bite wounds.  Inflammation or infection of the lining membranes of the brain (meningitis).  An infection in the blood (septicemia).  Dental infection (abscess).  Viral infection. This is rare. What increases the risk? Risk factors for preseptal cellulitis include:  Participating in activities that increase your risk of trauma to the face or head, such as boxing or high-speed activities.  Having a weakened defense system (immune system).  Medical conditions, such as nasal polyps, that increase your risk for frequent or recurrent sinus infections.  Not receiving regular dental care. What are the signs or symptoms? Symptoms of this condition usually come on suddenly. Symptoms may include:  Red, hot, and swollen eyelids.  Fever.  Difficulty opening your eye.  Eye pain. How is this diagnosed? This condition may be diagnosed by an eye exam. You may also have tests, such as:  Blood tests.  CT scan.  MRI.  Spinal tap (lumbar puncture). This is a procedure that involves removing and examining a small amount of the fluid that surrounds the brain and spinal cord. This checks for meningitis. How is this treated? Treatment for this condition will include antibiotic medicines. These may be given  by mouth (orally), through an IV, or as a shot. Your health care provider may also recommend nasal decongestants to reduce swelling. Follow these instructions at home:  Take your antibiotic medicine as directed by your health care provider. Finish all of it even if you start to feel better.  Take medicines only as directed by your health care provider.  Drink enough fluid to keep your urine clear or pale yellow.  Do not use any tobacco products, including cigarettes, chewing tobacco, or electronic cigarettes. If you need help quitting, ask your health care provider.  Keep all follow-up visits as directed by your health care provider. These include any visits with an eye specialist (ophthalmologist) or dentist. Contact a health care provider if:  You have a fever.  Your eyelids become more red, warm, or swollen.  You have new symptoms.  Your symptoms do not get better with treatment. Get help right away if:  You develop double vision, or your vision becomes blurred or worsens in any way.  You have trouble moving your eyes.  Your eye looks like it is sticking out or bulging out (proptosis).  You develop a severe headache, severe neck pain, or neck stiffness.  You develop repeated vomiting. This information is not intended to replace advice given to you by your health care provider. Make sure you discuss any questions you have with your health care provider. Document Released: 03/30/2010 Document Revised: 08/03/2015 Document Reviewed: 02/21/2014 Elsevier Interactive Patient Education  2017 [employer].     IF you received an x-ray today, you will receive an invoice from [facility]. Please contact [facility] at [phone] with  questions or concerns regarding your invoice.   IF you received labwork today, you will receive an invoice from [facility]. Please contact [facility] at [phone] with questions or concerns regarding your invoice.   Our billing  staff will not be able to assist you with questions regarding bills from these companies.  You will be contacted with the lab results as soon as they are available. The fastest way to get your results is to activate your My Chart account. Instructions are located on the last page of this paperwork. If you have not heard from [place] regarding the results in 2 weeks, please contact this office.

## 2016-05-25 ENCOUNTER — Ambulatory Visit (INDEPENDENT_AMBULATORY_CARE_PROVIDER_SITE_OTHER): Payer: Medicare Other | Admitting: Physician Assistant

## 2016-05-25 VITALS — BP 144/100 | HR 77 | Temp 97.8°F | Resp 16 | Ht 68.0 in | Wt 180.0 lb

## 2016-05-25 DIAGNOSIS — H00011 Hordeolum externum right upper eyelid: Secondary | ICD-10-CM | POA: Diagnosis not present

## 2016-05-25 MED ORDER — TOBRAMYCIN 0.3 % OP OINT: 1.0000 "application " | TOPICAL_OINTMENT | Freq: Three times a day (TID) | OPHTHALMIC | 0 refills | Status: AC

## 2016-05-25 NOTE — Patient Instructions (Addendum)
Warm compresses with gentle friction' No  Tear baby shampoo - wash out the eye    IF you received an x-ray today, you will receive an invoice from [facility]. Please contact [facility] at [phone] with questions or concerns regarding your invoice.   IF you received labwork today, you will receive an invoice from [facility]. Please contact [facility] at [phone] with questions or concerns regarding your invoice.   Our billing staff will not be able to assist you with questions regarding bills from these companies.  You will be contacted with the lab results as soon as they are available. The fastest way to get your results is to activate your My Chart account. Instructions are located on the last page of this paperwork. If you have not heard from [place] regarding the results in 2 weeks, please contact this office.    Stye A stye is a bump on your eyelid caused by a bacterial infection. A stye can form inside the eyelid (internal stye) or outside the eyelid (external stye). An internal stye may be caused by an infected oil-producing gland inside your eyelid. An external stye may be caused by an infection at the base of your eyelash (hair follicle). Styes are very common. Anyone can get them at any age. They usually occur in just one eye, but you may have more than one in either eye. What are the causes? The infection is almost always caused by bacteria called Staphylococcus aureus. This is a common type of bacteria that lives on your skin. What increases the risk? You may be at higher risk for a stye if you have had one before. You may also be at higher risk if you have:  Diabetes.  Long-term illness.  Long-term eye redness.  A skin condition called seborrhea.  High fat levels in your blood (lipids). What are the signs or symptoms? Eyelid pain is the most common symptom of a stye. Internal styes are more painful than external styes. Other signs and symptoms may  include:  Painful swelling of your eyelid.  A scratchy feeling in your eye.  Tearing and redness of your eye.  Pus draining from the stye. How is this diagnosed? Your health care provider may be able to diagnose a stye just by examining your eye. The health care provider may also check to make sure:  You do not have a fever or other signs of a more serious infection.  The infection has not spread to other parts of your eye or areas around your eye. How is this treated? Most styes will clear up in a few days without treatment. In some cases, you may need to use antibiotic drops or ointment to prevent infection. Your health care provider may have to drain the stye surgically if your stye is:  Large.  Causing a lot of pain.  Interfering with your vision. This can be done using a thin blade or a needle. Follow these instructions at home:  Take medicines only as directed by your health care provider.  Apply a clean, warm compress to your eye for 10 minutes, 4 times a day.  Do not wear contact lenses or eye makeup until your stye has healed.  Do not try to pop or drain the stye. Contact a health care provider if:  You have chills or a fever.  Your stye does not go away after several days.  Your stye affects your vision.  Your eyeball becomes swollen, red, or painful. This information is  not intended to replace advice given to you by your health care provider. Make sure you discuss any questions you have with your health care provider. Document Released: 12/05/2004 Document Revised: 10/22/2015 Document Reviewed: 06/11/2013 Elsevier Interactive Patient Education  2017 [employer].

## 2016-05-25 NOTE — Progress Notes (Signed)
   Allen Cole  MRN: [id number] DOB: 1960/12/12  PCP: Allen Cole., MD  Chief Complaint  Patient presents with  . Follow-up    Right eye infection, feeling a little better    Subjective:  Pt presents to clinic for eye recheck.  He has been using hot compresses - very itchy eyelid - slight decrease in swelling - painful for the last several weeks without injury to the area.  No eyeball pain or trouble with his vision.  Review of Systems  Eyes: Negative for pain, discharge, redness, itching and visual disturbance.    There are no active problems to display for this patient.   Current Outpatient Prescriptions on File Prior to Visit  Medication Sig Dispense Refill  . cefdinir (OMNICEF) 300 MG capsule Take 2 capsules (600 mg total) by mouth daily. 20 capsule 0  . olmesartan-hydrochlorothiazide (BENICAR HCT) 20-12.5 MG per tablet Take 1 tablet by mouth daily.    . traMADol (ULTRAM) 50 MG tablet Take by mouth every 6 (six) hours as needed.     No current facility-administered medications on file prior to visit.     No Known Allergies  Pt patients past, family and social history were reviewed and updated.   Objective:  BP (!) 144/100   Pulse 77   Temp 97.8 F (36.6 C) (Oral)   Resp 16   Ht 5\' 8"  (1.727 m)   Wt 180 lb (81.6 kg)   SpO2 94%   BMI 27.37 kg/m   Physical Exam  Constitutional: He is oriented to person, place, and time and well-developed, well-nourished, and in no distress.  HENT:  Head: Normocephalic and atraumatic.  Right Ear: External ear normal.  Left Ear: External ear normal.  Eyes: Conjunctivae and EOM are normal. Pupils are equal, round, and reactive to light. Right eye exhibits hordeolum (upper eyelid - lateral 1/3 of eyelid border affected). Left eye exhibits no hordeolum.    Eyelid inverted - no pustule seen - seems to be worse at the eyelash edge of upper eyelid  Neck: Normal range of motion.  Pulmonary/Chest: Effort normal.    Neurological: He is alert and oriented to person, place, and time. Gait normal.  Skin: Skin is warm and dry.  Psychiatric: Mood, memory, affect and judgment normal.    Assessment and Plan :  Hordeolum externum of right upper eyelid - Plan: tobramycin (TOBREX) 0.3 % ophthalmic ointment - pt is not interested in continuing oral abx - we will switch to ointment that he has had in the past which worked well - if he is not getting better he will contact me in 48h for a referral to ophtho due to length of time it has been present.  Continue warm compresses - encouraged no tear baby shampoo washes - because he gets this frequenct he was encouraged to do this even after this heals to prevent further styes from starting  [facility] at [place] 05/25/2016 12:42 PM

## 2016-05-28 DIAGNOSIS — M25511 Pain in right shoulder: Secondary | ICD-10-CM | POA: Diagnosis not present

## 2016-05-28 DIAGNOSIS — M75121 Complete rotator cuff tear or rupture of right shoulder, not specified as traumatic: Secondary | ICD-10-CM | POA: Diagnosis not present

## 2016-05-30 DIAGNOSIS — M25511 Pain in right shoulder: Secondary | ICD-10-CM | POA: Diagnosis not present

## 2016-05-30 DIAGNOSIS — M75121 Complete rotator cuff tear or rupture of right shoulder, not specified as traumatic: Secondary | ICD-10-CM | POA: Diagnosis not present

## 2016-06-04 DIAGNOSIS — M75121 Complete rotator cuff tear or rupture of right shoulder, not specified as traumatic: Secondary | ICD-10-CM | POA: Diagnosis not present

## 2016-06-04 DIAGNOSIS — M25511 Pain in right shoulder: Secondary | ICD-10-CM | POA: Diagnosis not present

## 2016-06-05 DIAGNOSIS — Z9889 Other specified postprocedural states: Secondary | ICD-10-CM | POA: Diagnosis not present

## 2016-06-06 DIAGNOSIS — M25511 Pain in right shoulder: Secondary | ICD-10-CM | POA: Diagnosis not present

## 2016-06-06 DIAGNOSIS — M75121 Complete rotator cuff tear or rupture of right shoulder, not specified as traumatic: Secondary | ICD-10-CM | POA: Diagnosis not present

## 2016-06-11 DIAGNOSIS — M25511 Pain in right shoulder: Secondary | ICD-10-CM | POA: Diagnosis not present

## 2016-06-11 DIAGNOSIS — M75121 Complete rotator cuff tear or rupture of right shoulder, not specified as traumatic: Secondary | ICD-10-CM | POA: Diagnosis not present

## 2016-06-18 DIAGNOSIS — M25511 Pain in right shoulder: Secondary | ICD-10-CM | POA: Diagnosis not present

## 2016-06-18 DIAGNOSIS — M75121 Complete rotator cuff tear or rupture of right shoulder, not specified as traumatic: Secondary | ICD-10-CM | POA: Diagnosis not present

## 2016-06-20 DIAGNOSIS — M75121 Complete rotator cuff tear or rupture of right shoulder, not specified as traumatic: Secondary | ICD-10-CM | POA: Diagnosis not present

## 2016-06-20 DIAGNOSIS — M25511 Pain in right shoulder: Secondary | ICD-10-CM | POA: Diagnosis not present

## 2016-07-02 DIAGNOSIS — M25511 Pain in right shoulder: Secondary | ICD-10-CM | POA: Diagnosis not present

## 2016-07-02 DIAGNOSIS — M75121 Complete rotator cuff tear or rupture of right shoulder, not specified as traumatic: Secondary | ICD-10-CM | POA: Diagnosis not present

## 2016-07-10 DIAGNOSIS — M25511 Pain in right shoulder: Secondary | ICD-10-CM | POA: Diagnosis not present

## 2016-07-10 DIAGNOSIS — M75121 Complete rotator cuff tear or rupture of right shoulder, not specified as traumatic: Secondary | ICD-10-CM | POA: Diagnosis not present

## 2016-07-17 DIAGNOSIS — Z9889 Other specified postprocedural states: Secondary | ICD-10-CM | POA: Diagnosis not present

## 2016-07-23 DIAGNOSIS — M25511 Pain in right shoulder: Secondary | ICD-10-CM | POA: Diagnosis not present

## 2016-07-23 DIAGNOSIS — M75121 Complete rotator cuff tear or rupture of right shoulder, not specified as traumatic: Secondary | ICD-10-CM | POA: Diagnosis not present

## 2016-07-25 DIAGNOSIS — M75121 Complete rotator cuff tear or rupture of right shoulder, not specified as traumatic: Secondary | ICD-10-CM | POA: Diagnosis not present

## 2016-07-25 DIAGNOSIS — M25511 Pain in right shoulder: Secondary | ICD-10-CM | POA: Diagnosis not present

## 2016-07-30 DIAGNOSIS — M75121 Complete rotator cuff tear or rupture of right shoulder, not specified as traumatic: Secondary | ICD-10-CM | POA: Diagnosis not present

## 2016-07-30 DIAGNOSIS — M25511 Pain in right shoulder: Secondary | ICD-10-CM | POA: Diagnosis not present

## 2016-08-06 DIAGNOSIS — M25511 Pain in right shoulder: Secondary | ICD-10-CM | POA: Diagnosis not present

## 2016-08-06 DIAGNOSIS — M75121 Complete rotator cuff tear or rupture of right shoulder, not specified as traumatic: Secondary | ICD-10-CM | POA: Diagnosis not present

## 2016-08-08 DIAGNOSIS — M75121 Complete rotator cuff tear or rupture of right shoulder, not specified as traumatic: Secondary | ICD-10-CM | POA: Diagnosis not present

## 2016-08-08 DIAGNOSIS — M25511 Pain in right shoulder: Secondary | ICD-10-CM | POA: Diagnosis not present

## 2016-08-13 DIAGNOSIS — M75121 Complete rotator cuff tear or rupture of right shoulder, not specified as traumatic: Secondary | ICD-10-CM | POA: Diagnosis not present

## 2016-08-13 DIAGNOSIS — M25511 Pain in right shoulder: Secondary | ICD-10-CM | POA: Diagnosis not present

## 2016-08-28 DIAGNOSIS — M25511 Pain in right shoulder: Secondary | ICD-10-CM | POA: Diagnosis not present

## 2016-11-08 DIAGNOSIS — Z09 Encounter for follow-up examination after completed treatment for conditions other than malignant neoplasm: Secondary | ICD-10-CM | POA: Diagnosis not present

## 2017-02-04 ENCOUNTER — Encounter: Payer: Self-pay | Admitting: Internal Medicine

## 2017-02-05 ENCOUNTER — Other Ambulatory Visit: Payer: Self-pay | Admitting: Gastroenterology

## 2017-02-05 DIAGNOSIS — R945 Abnormal results of liver function studies: Principal | ICD-10-CM

## 2017-02-05 DIAGNOSIS — I10 Essential (primary) hypertension: Secondary | ICD-10-CM | POA: Diagnosis not present

## 2017-02-05 DIAGNOSIS — R140 Abdominal distension (gaseous): Secondary | ICD-10-CM | POA: Diagnosis not present

## 2017-02-05 DIAGNOSIS — R740 Nonspecific elevation of levels of transaminase and lactic acid dehydrogenase [LDH]: Secondary | ICD-10-CM | POA: Diagnosis not present

## 2017-02-05 DIAGNOSIS — R7989 Other specified abnormal findings of blood chemistry: Secondary | ICD-10-CM

## 2017-02-05 DIAGNOSIS — Z800 Family history of malignant neoplasm of digestive organs: Secondary | ICD-10-CM | POA: Diagnosis not present

## 2017-02-07 ENCOUNTER — Ambulatory Visit
Admission: RE | Admit: 2017-02-07 | Discharge: 2017-02-07 | Disposition: A | Discharge status: Home / Self Care | Payer: Medicare Other | Source: Ambulatory Visit | Attending: Gastroenterology | Admitting: Gastroenterology

## 2017-02-07 DIAGNOSIS — R945 Abnormal results of liver function studies: Secondary | ICD-10-CM | POA: Diagnosis not present

## 2017-02-07 DIAGNOSIS — R7989 Other specified abnormal findings of blood chemistry: Secondary | ICD-10-CM

## 2017-02-11 DIAGNOSIS — R140 Abdominal distension (gaseous): Secondary | ICD-10-CM | POA: Diagnosis not present

## 2017-02-11 DIAGNOSIS — R1084 Generalized abdominal pain: Secondary | ICD-10-CM | POA: Diagnosis not present

## 2017-02-11 DIAGNOSIS — K228 Other specified diseases of esophagus: Secondary | ICD-10-CM | POA: Diagnosis not present

## 2017-02-11 DIAGNOSIS — K2970 Gastritis, unspecified, without bleeding: Secondary | ICD-10-CM | POA: Diagnosis not present

## 2017-02-11 DIAGNOSIS — K2990 Gastroduodenitis, unspecified, without bleeding: Secondary | ICD-10-CM | POA: Diagnosis not present

## 2017-02-11 DIAGNOSIS — K209 Esophagitis, unspecified: Secondary | ICD-10-CM | POA: Diagnosis not present

## 2017-02-11 DIAGNOSIS — K319 Disease of stomach and duodenum, unspecified: Secondary | ICD-10-CM | POA: Diagnosis not present

## 2017-03-13 ENCOUNTER — Ambulatory Visit: Payer: Medicare Other | Admitting: Family Medicine

## 2017-03-19 DIAGNOSIS — M549 Dorsalgia, unspecified: Secondary | ICD-10-CM | POA: Diagnosis not present

## 2017-03-19 DIAGNOSIS — R740 Nonspecific elevation of levels of transaminase and lactic acid dehydrogenase [LDH]: Secondary | ICD-10-CM | POA: Diagnosis not present

## 2017-03-19 DIAGNOSIS — R140 Abdominal distension (gaseous): Secondary | ICD-10-CM | POA: Diagnosis not present

## 2017-03-26 ENCOUNTER — Ambulatory Visit: Payer: Medicare Other | Admitting: Internal Medicine

## 2017-09-05 IMAGING — DX DG SHOULDER 2+V*R*
3 series · 3 of 3 positions shown · non-contrast
Comparison: 10/09/2015

CLINICAL DATA: Shoulder pain from fall

EXAM:
RIGHT SHOULDER - 2+ VIEW

[shoulder ap]
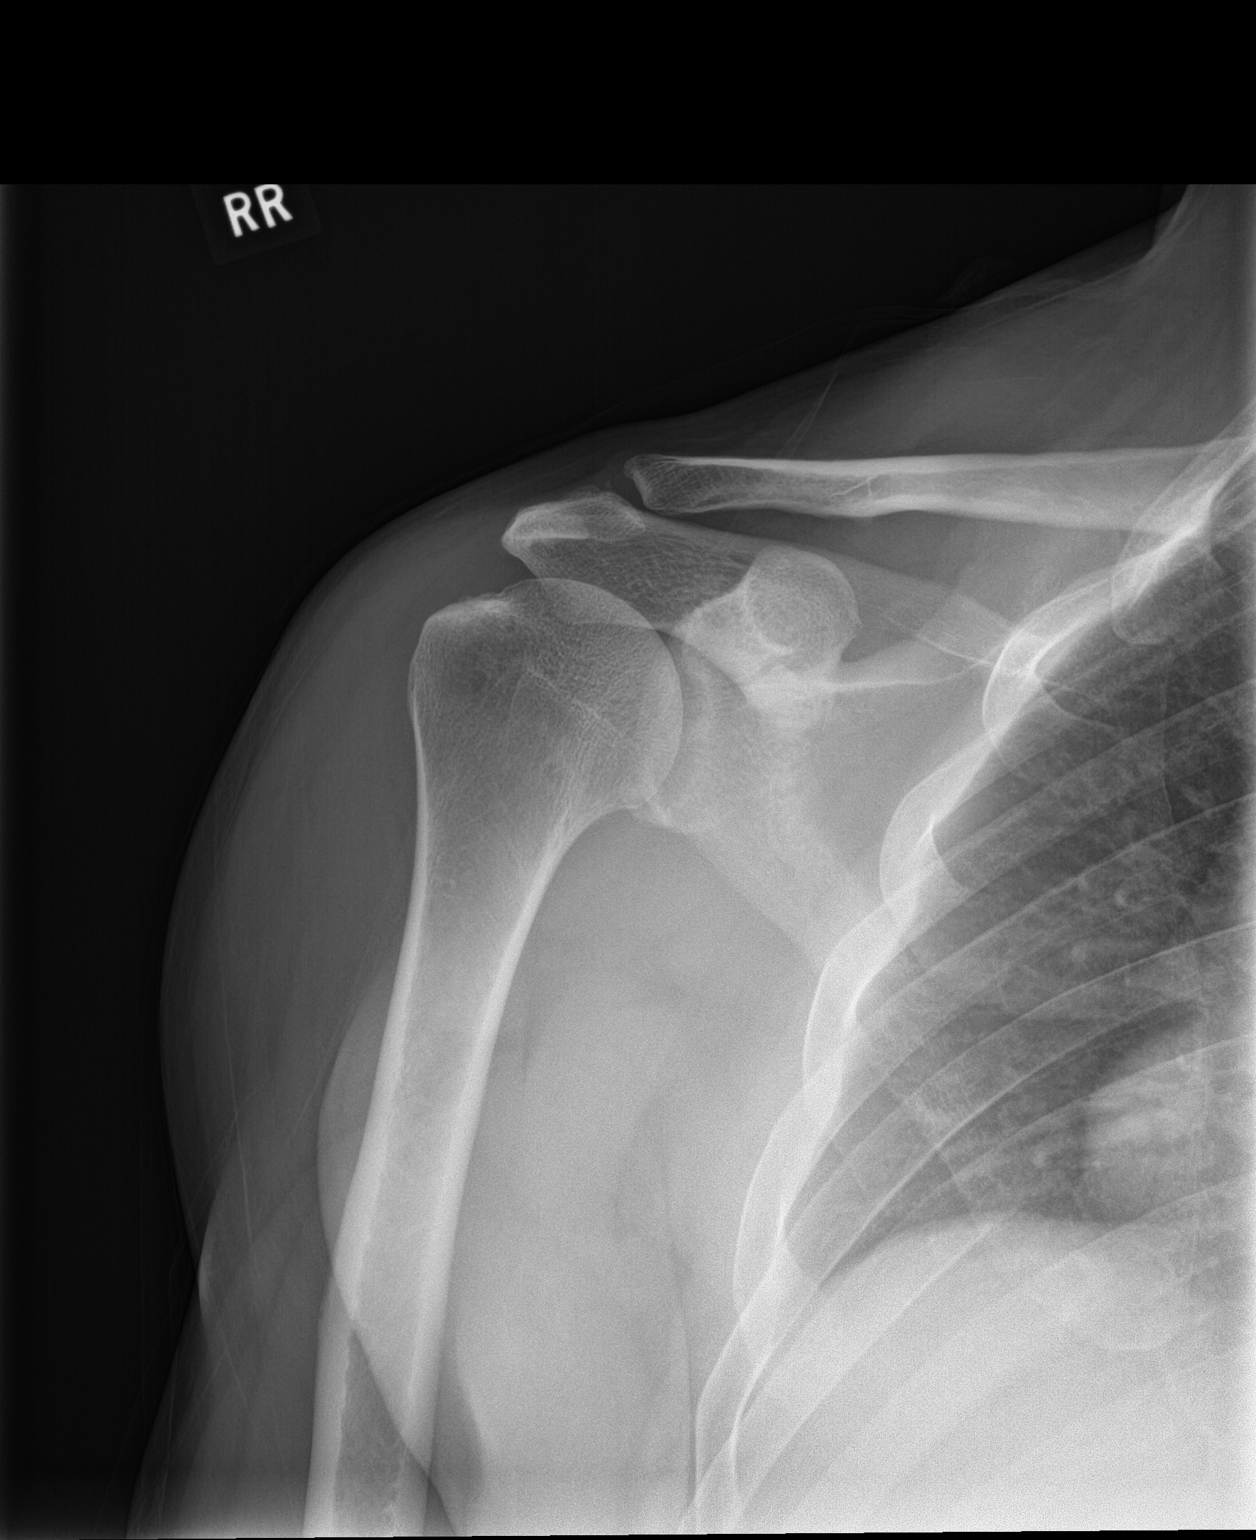

[shoulder y-view]
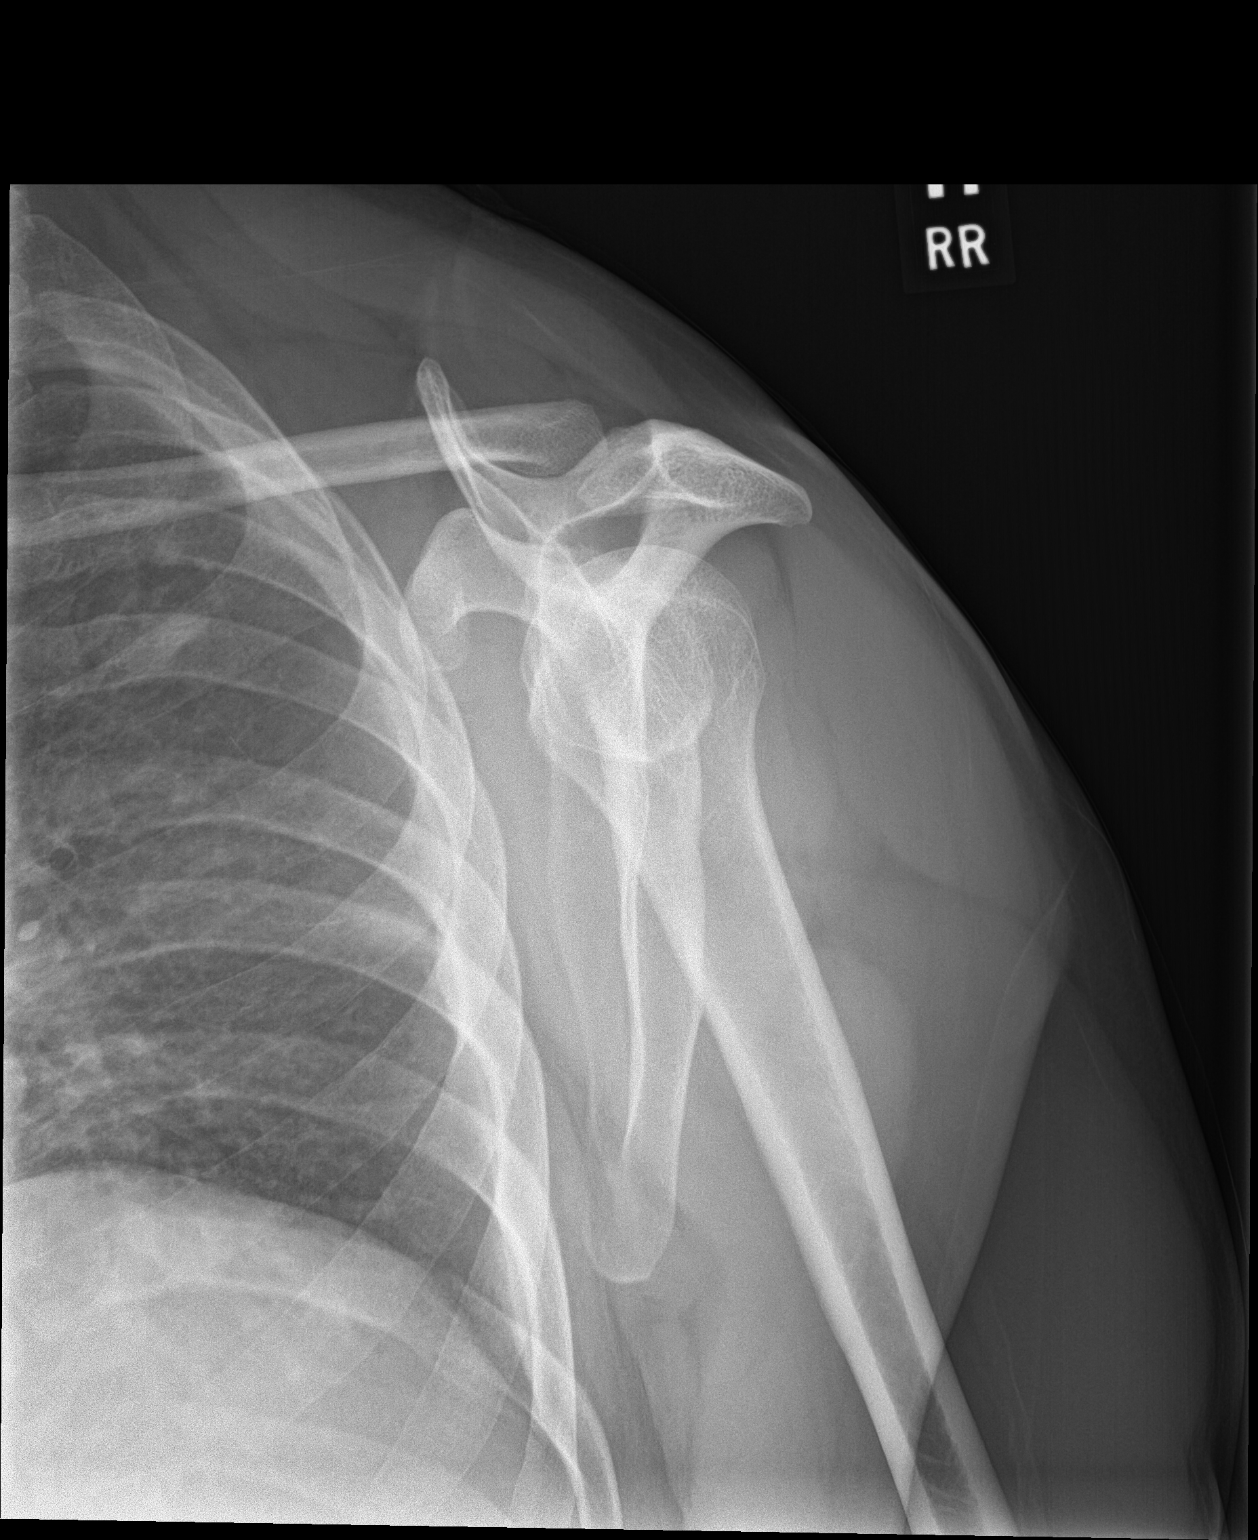

[shoulder axial]
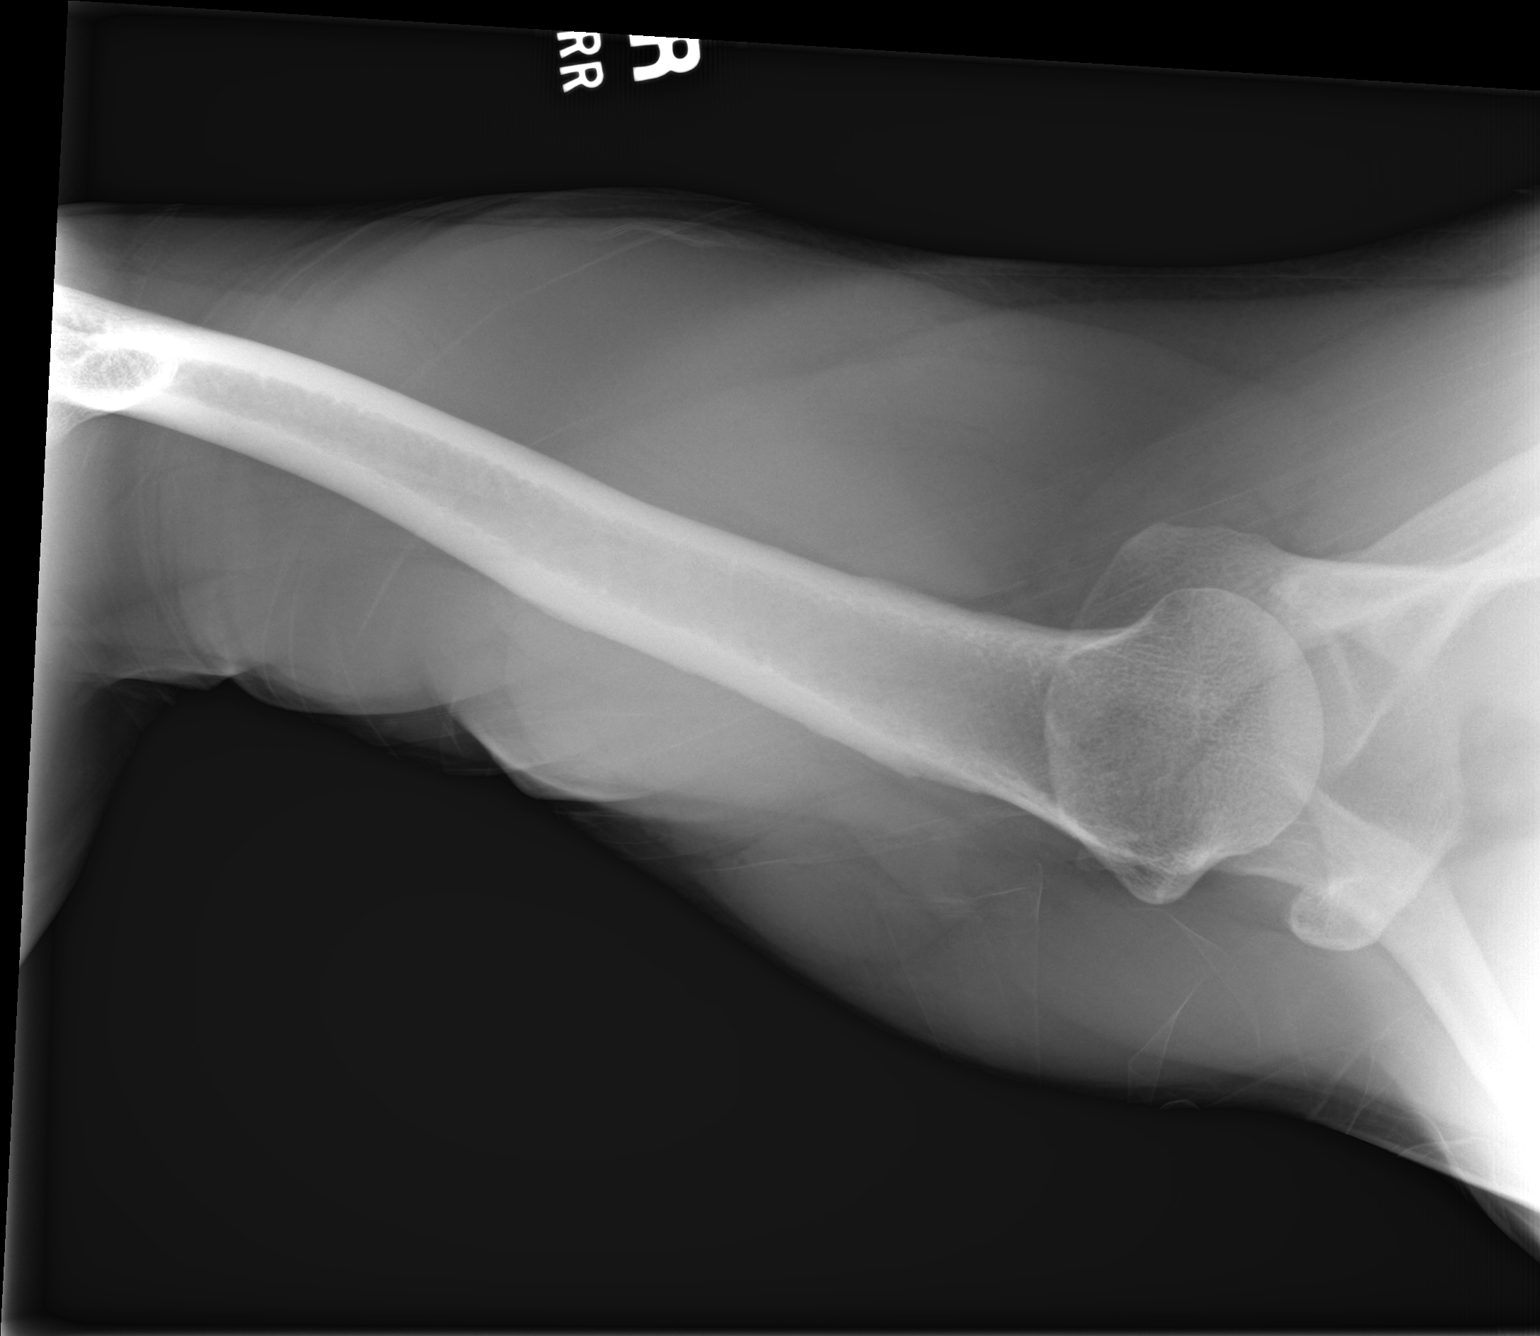

[3 of 3 positions shown; findings below may reference images not displayed]

FINDINGS: No fracture or dislocation. The AC joint does not appear widened.
Minimal degenerative changes are present. The right lung apex is
clear.
IMPRESSION: No acute osseous abnormality

## 2017-09-05 IMAGING — DX DG WRIST COMPLETE 3+V*R*
4 series · 4 of 4 positions shown · non-contrast
Comparison: None.

CLINICAL DATA: Right wrist injury from fall on ice, pain and
swelling

EXAM:
RIGHT WRIST - COMPLETE 3+ VIEW

[wrist pa]
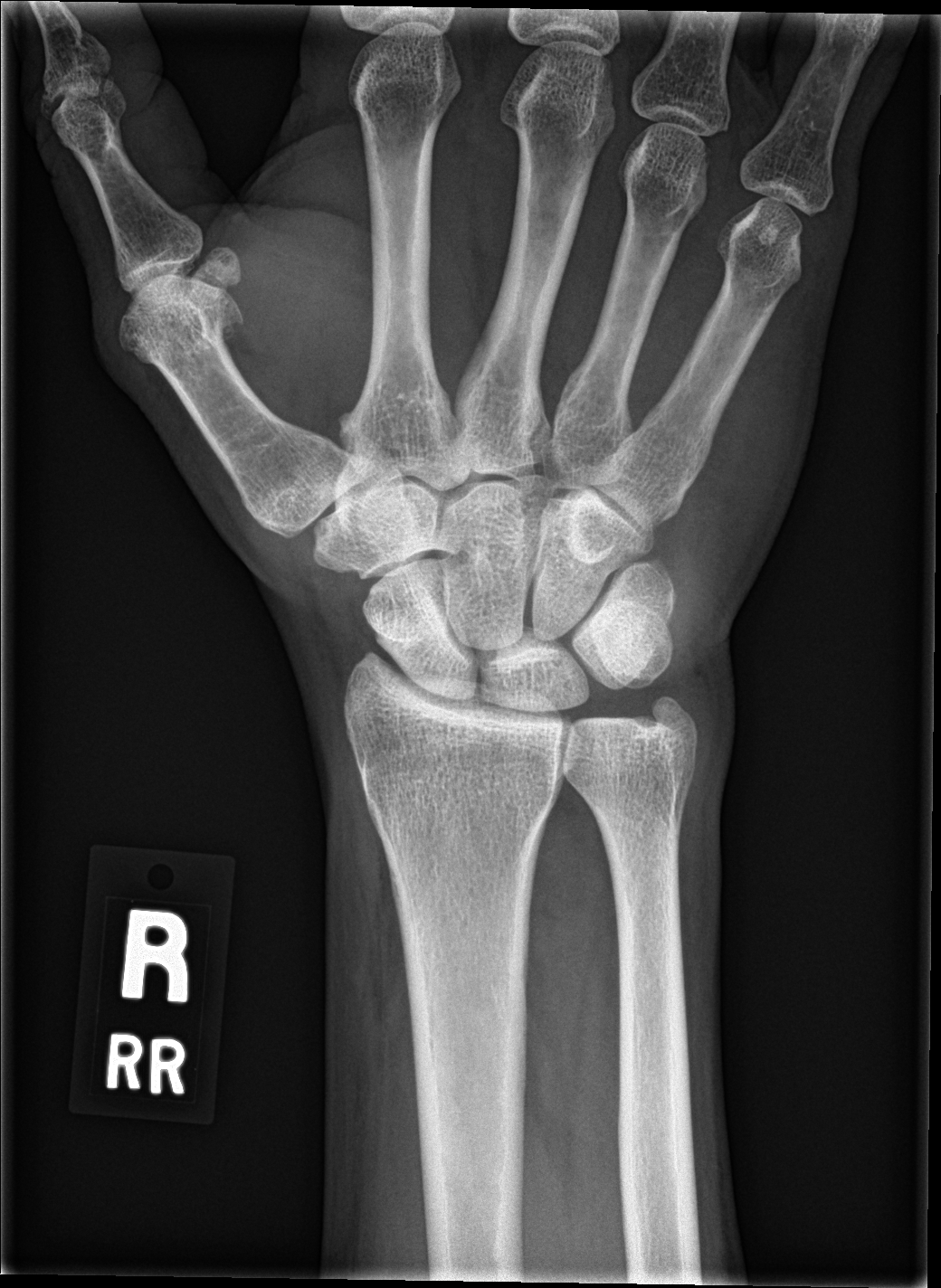

[wrist obl]
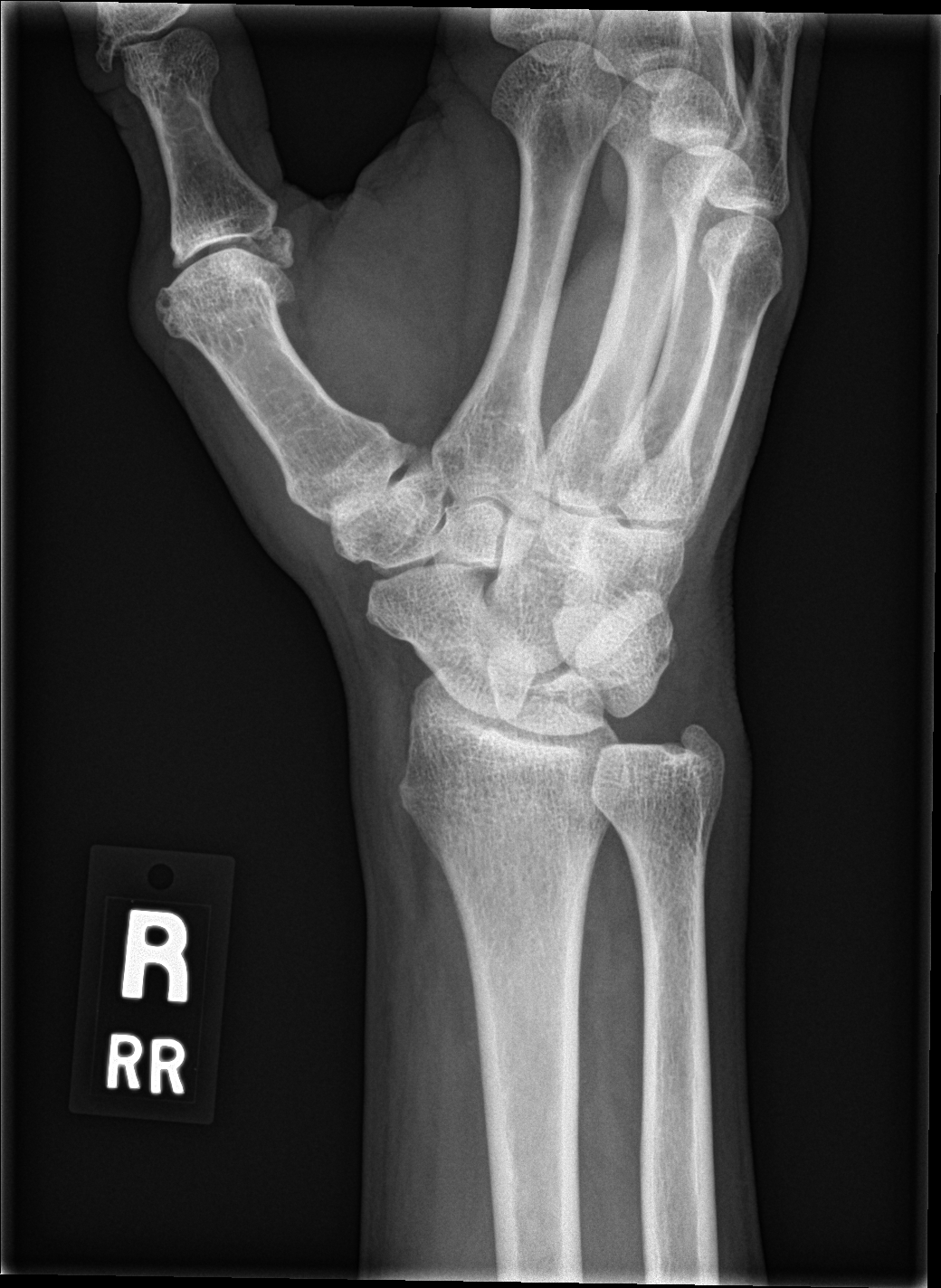

[wrist lat]
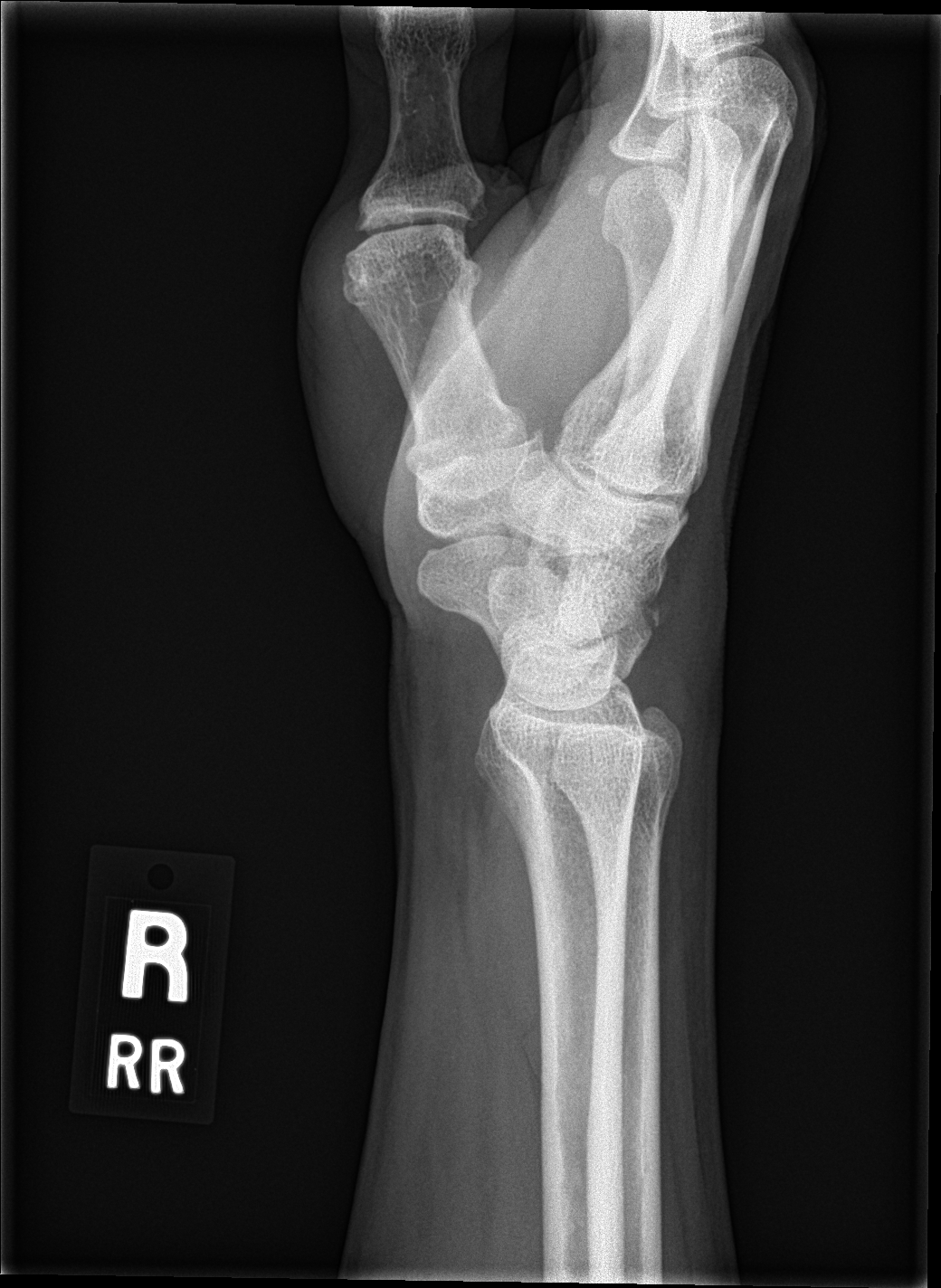

[wrist navicular]
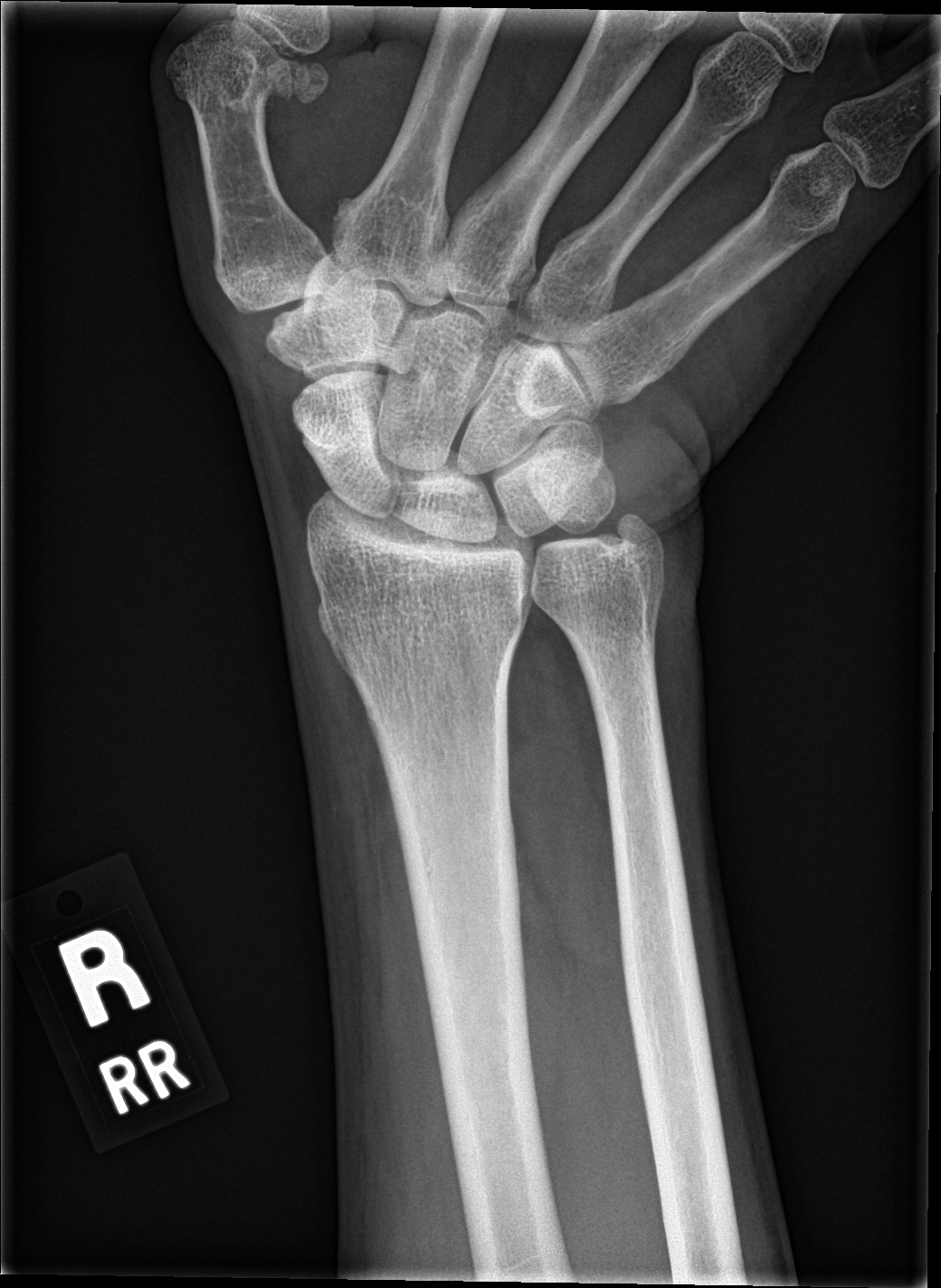

[4 of 4 positions shown; findings below may reference images not displayed]

FINDINGS: Tiny os ossific density dorsal to the wrist on the lateral image is
suspicious for triquetrum fracture. No subluxation. No radiopaque
foreign body.
IMPRESSION: Findings suspicious for triquetrum fracture.

## 2018-06-24 DIAGNOSIS — M25512 Pain in left shoulder: Secondary | ICD-10-CM | POA: Diagnosis not present

## 2018-06-26 DIAGNOSIS — M25512 Pain in left shoulder: Secondary | ICD-10-CM | POA: Diagnosis not present

## 2018-07-01 DIAGNOSIS — M75112 Incomplete rotator cuff tear or rupture of left shoulder, not specified as traumatic: Secondary | ICD-10-CM | POA: Diagnosis not present

## 2021-07-03 ENCOUNTER — Other Ambulatory Visit: Payer: Self-pay | Admitting: Orthopedic Surgery

## 2021-07-17 NOTE — Progress Notes (Addendum)
Surgical Instructions ? ? ? Your procedure is scheduled on Jul 25, 2021. ? Report to [facility] Main Entrance "A" at 9:40 A.M., then check in with the Admitting office. ? Call this number if you have problems the morning of surgery: ? [phone] ? ? If you have any questions prior to your surgery date call [phone]: Open Monday-Friday 8am-4pm ? ? ? Remember: ? Do not eat after midnight the night before your surgery ? ?You may drink clear liquids until 9:40am  the morning of your surgery.   ?Clear liquids allowed are: Water, Non-Citrus Juices (without pulp), Carbonated Beverages, Clear Tea, Black Coffee ONLY (NO MILK, CREAM OR POWDERED CREAMER of any kind), and Gatorade ?Patient Instructions ? ?The night before surgery:  ?No food after midnight. ONLY clear liquids after midnight ? ?The day of surgery (if you do NOT have diabetes):  ?Drink ONE (1) Pre-Surgery Clear Ensure by 9:40am the morning of surgery. Drink in one sitting. Do not sip.  ?This drink was given to you during your hospital  ?pre-op appointment visit. ? ?Nothing else to drink after completing the  ?Pre-Surgery Clear Ensure. ? ?       If you have questions, please contact your surgeon?s office.  ? ? ? ? Take these medicines the morning of surgery with A SIP OF WATER:  ?azelastine (ASTELIN) ?buPROPion (WELLBUTRIN XL)  ?cetirizine (ZYRTEC) ?fluticasone (FLONASE) ?fluticasone (FLOVENT HFA) inhaler ?gabapentin (NEURONTIN) ?pantoprazole (PROTONIX) ?venlafaxine XR (EFFEXOR-XR)  ?Polyethyl Glycol-Propyl Glycol (SYSTANE OP) if needed ? ? ?As of today, STOP taking any Aspirin (unless otherwise instructed by your surgeon) Aleve, Naproxen, Ibuprofen, Motrin, Advil, Goody's, BC's, all herbal medications, fish oil, and all vitamins. ? ?         ?Do not wear jewelry or makeup ?Do not wear lotions, powders, perfumes/colognes, or deodorant. ?Do not shave 48 hours prior to surgery.  Men may shave face and neck. ?Do not bring valuables to the hospital. ?Do not wear  nail polish, gel polish, artificial nails, or any other type of covering on natural nails (fingers and toes) ?If you have artificial nails or gel coating that need to be removed by a nail salon, please have this removed prior to surgery. Artificial nails or gel coating may interfere with anesthesia's ability to adequately monitor your vital signs. ? ?[facility] is not responsible for any belongings or valuables. .  ? ?Do NOT Smoke (Tobacco/Vaping)  24 hours prior to your procedure ? ?If you use a CPAP at night, you may bring your mask for your overnight stay. ?  ?Contacts, glasses, hearing aids, dentures or partials may not be worn into surgery, please bring cases for these belongings ?  ?For patients admitted to the hospital, discharge time will be determined by your treatment team. ?  ?Patients discharged the day of surgery will not be allowed to drive home, and someone needs to stay with them for 24 hours. ? ? ?SURGICAL WAITING ROOM VISITATION ?Patients having surgery or a procedure in a hospital may have two support people. ?Children under the age of 48 must have an adult with them who is not the patient. ?They may stay in the waiting area during the procedure and may switch out with other visitors. If the patient needs to stay at the hospital during part of their recovery, the visitor guidelines for inpatient rooms apply. ? ?Please refer to the [facility] website for the visitor guidelines for Inpatients (after your surgery is over and you are in a regular  room).  ? ? ? ? ? ?Special instructions:   ? ?Oral Hygiene is also important to reduce your risk of infection.  Remember - BRUSH YOUR TEETH THE MORNING OF SURGERY WITH YOUR REGULAR TOOTHPASTE ? ? ?[facility]- Preparing For Surgery ? ?Before surgery, you can play an important role. Because skin is not sterile, your skin needs to be as free of germs as possible. You can reduce the number of germs on your skin by washing with CHG (chlorahexidine gluconate)  Soap before surgery.  CHG is an antiseptic cleaner which kills germs and bonds with the skin to continue killing germs even after washing.   ? ? ?Please do not use if you have an allergy to CHG or antibacterial soaps. If your skin becomes reddened/irritated stop using the CHG.  ?Do not shave (including legs and underarms) for at least 48 hours prior to first CHG shower. It is OK to shave your face. ? ?Please follow these instructions carefully. ?  ? ? Shower the NIGHT BEFORE SURGERY and the MORNING OF SURGERY with CHG Soap.  ? If you chose to wash your hair, wash your hair first as usual with your normal shampoo. After you shampoo, rinse your hair and body thoroughly to remove the shampoo.  Then [employer] and genitals (private parts) with your normal soap and rinse thoroughly to remove soap. ? ?After that Use CHG Soap as you would any other liquid soap. You can apply CHG directly to the skin and wash gently with a scrungie or a clean washcloth.  ? ?Apply the CHG Soap to your body ONLY FROM THE NECK DOWN.  Do not use on open wounds or open sores. Avoid contact with your eyes, ears, mouth and genitals (private parts). Wash Face and genitals (private parts)  with your normal soap.  ? ?Wash thoroughly, paying special attention to the area where your surgery will be performed. ? ?Thoroughly rinse your body with warm water from the neck down. ? ?DO NOT shower/wash with your normal soap after using and rinsing off the CHG Soap. ? ?Pat yourself dry with a CLEAN TOWEL. ? ?Wear CLEAN PAJAMAS to bed the night before surgery ? ?Place CLEAN SHEETS on your bed the night before your surgery ? ?DO NOT SLEEP WITH PETS. ? ? ?Day of Surgery: ? ?Take a shower with CHG soap. ?Wear Clean/Comfortable clothing the morning of surgery ?Do not apply any deodorants/lotions.   ?Remember to brush your teeth WITH YOUR REGULAR TOOTHPASTE. ? ? ? ?If you received a COVID test during your pre-op visit, it is requested that you wear a mask when out in  public, stay away from anyone that may not be feeling well, and notify your surgeon if you develop symptoms. If you have been in contact with anyone that has tested positive in the last 10 days, please notify your surgeon. ? ?  ?Please read over the following fact sheets that you were given.   ?

## 2021-07-18 ENCOUNTER — Other Ambulatory Visit: Payer: Self-pay

## 2021-07-18 ENCOUNTER — Encounter (HOSPITAL_COMMUNITY)
Admission: RE | Admit: 2021-07-18 | Discharge: 2021-07-18 | Disposition: A | Discharge status: Home / Self Care | Payer: Medicare HMO | Source: Ambulatory Visit | Attending: Orthopedic Surgery | Admitting: Orthopedic Surgery

## 2021-07-18 ENCOUNTER — Encounter (HOSPITAL_COMMUNITY): Payer: Self-pay

## 2021-07-18 VITALS — BP 124/79 | HR 70 | Temp 98.1°F | Resp 17 | Ht 68.0 in | Wt 196.5 lb

## 2021-07-18 DIAGNOSIS — Z01818 Encounter for other preprocedural examination: Secondary | ICD-10-CM | POA: Diagnosis present

## 2021-07-18 HISTORY — DX: Sleep apnea, unspecified: G47.30

## 2021-07-18 LAB — CBC
HCT: 38.4 % — ABNORMAL LOW (ref 39.0–52.0)
Hemoglobin: 13.1 g/dL (ref 13.0–17.0)
MCH: 31.7 pg (ref 26.0–34.0)
MCHC: 34.1 g/dL (ref 30.0–36.0)
MCV: 93 fL (ref 80.0–100.0)
Platelets: 200 10*3/uL (ref 150–400)
RBC: 4.13 MIL/uL — ABNORMAL LOW (ref 4.22–5.81)
RDW: 11.9 % (ref 11.5–15.5)
WBC: 8 10*3/uL (ref 4.0–10.5)
nRBC: 0 % (ref 0.0–0.2)

## 2021-07-18 LAB — SURGICAL PCR SCREEN
MRSA, PCR: NEGATIVE
Staphylococcus aureus: NEGATIVE

## 2021-07-18 LAB — BASIC METABOLIC PANEL
Anion gap: 7 (ref 5–15)
BUN: 16 mg/dL (ref 6–20)
CO2: 28 mmol/L (ref 22–32)
Calcium: 9.4 mg/dL (ref 8.9–10.3)
Chloride: 103 mmol/L (ref 98–111)
Creatinine, Ser: 1.13 mg/dL (ref 0.61–1.24)
GFR, Estimated: >60 mL/min (ref >60)
Glucose, Bld: 123 mg/dL — ABNORMAL HIGH (ref 70–99)
Potassium: 3.3 mmol/L — ABNORMAL LOW (ref 3.5–5.1)
Sodium: 138 mmol/L (ref 135–145)

## 2021-07-18 NOTE — Progress Notes (Signed)
PCP - Annetta Maw, [place] ?Cardiologist - denies ? ?PPM/ICD - denies ?Device Orders -  ?Rep Notified -  ? ?Chest x-ray -  ?EKG - 07/18/21 ?Stress Test - none ?ECHO - none ?Cardiac Cath - none ? ?Sleep Study - pt was diagnosed with sleep apnea and has a CPAP,but is not using it currently.  ?CPAP -  ? ?Fasting Blood Sugar - na ?Checks Blood Sugar _____ times a day ? ?Blood Thinner Instructions:na ?Aspirin Instructions:na ? ?ERAS Protcol - clear liquids until 0940 ?PRE-SURGERY Ensure or G2- Ensure ? ?COVID TEST- na  ? ? ?Anesthesia review: no ? ?Patient denies shortness of breath, fever, cough and chest pain at PAT appointment ? ? ?All instructions explained to the patient, with a verbal understanding of the material. Patient agrees to go over the instructions while at home for a better understanding. Patient also instructed to wear a mask when out in public prior to surgery. The opportunity to ask questions was provided. ?  ?

## 2021-07-25 ENCOUNTER — Other Ambulatory Visit: Payer: Self-pay

## 2021-07-25 ENCOUNTER — Encounter (HOSPITAL_COMMUNITY): Payer: Self-pay | Admitting: Orthopedic Surgery

## 2021-07-25 ENCOUNTER — Ambulatory Visit (HOSPITAL_BASED_OUTPATIENT_CLINIC_OR_DEPARTMENT_OTHER): Payer: Medicare HMO | Admitting: Certified Registered Nurse Anesthetist

## 2021-07-25 ENCOUNTER — Ambulatory Visit (HOSPITAL_COMMUNITY): Payer: Medicare HMO | Admitting: Certified Registered Nurse Anesthetist

## 2021-07-25 ENCOUNTER — Ambulatory Visit (HOSPITAL_COMMUNITY): Payer: Medicare HMO

## 2021-07-25 ENCOUNTER — Ambulatory Visit (HOSPITAL_COMMUNITY)
Admission: RE | Admit: 2021-07-25 | Discharge: 2021-07-25 | Disposition: A | Discharge status: Home / Self Care | Payer: Medicare HMO | Source: Ambulatory Visit | Attending: Orthopedic Surgery | Admitting: Orthopedic Surgery

## 2021-07-25 ENCOUNTER — Ambulatory Visit (HOSPITAL_COMMUNITY)
Admission: RE | Disposition: A | Discharge status: Home / Self Care | Payer: Self-pay | Source: Ambulatory Visit | Attending: Orthopedic Surgery

## 2021-07-25 DIAGNOSIS — I10 Essential (primary) hypertension: Secondary | ICD-10-CM | POA: Diagnosis not present

## 2021-07-25 DIAGNOSIS — M50123 Cervical disc disorder at C6-C7 level with radiculopathy: Secondary | ICD-10-CM | POA: Diagnosis present

## 2021-07-25 DIAGNOSIS — F32A Depression, unspecified: Secondary | ICD-10-CM | POA: Diagnosis not present

## 2021-07-25 DIAGNOSIS — M4802 Spinal stenosis, cervical region: Secondary | ICD-10-CM | POA: Insufficient documentation

## 2021-07-25 DIAGNOSIS — M5412 Radiculopathy, cervical region: Secondary | ICD-10-CM | POA: Diagnosis not present

## 2021-07-25 DIAGNOSIS — M5010 Cervical disc disorder with radiculopathy, unspecified cervical region: Secondary | ICD-10-CM

## 2021-07-25 HISTORY — PX: ANTERIOR CERVICAL DECOMP/DISCECTOMY FUSION: SHX1161

## 2021-07-25 SURGERY — ANTERIOR CERVICAL DECOMPRESSION/DISCECTOMY FUSION 1 LEVEL
Anesthesia: General

## 2021-07-25 MED ORDER — ROCURONIUM BROMIDE 10 MG/ML (PF) SYRINGE
PREFILLED_SYRINGE | INTRAVENOUS | Status: DC | PRN
  Administered 2021-07-25: 10 mg via INTRAVENOUS
  Administered 2021-07-25: 90 mg via INTRAVENOUS

## 2021-07-25 MED ORDER — ONDANSETRON HCL 4 MG/2ML IJ SOLN
INTRAMUSCULAR | Status: DC | PRN
Start: 2021-07-25 — End: 2021-07-25
  Administered 2021-07-25: 4 mg via INTRAVENOUS

## 2021-07-25 MED ORDER — OXYCODONE HCL 5 MG PO TABS
ORAL_TABLET | ORAL | Status: AC
  Filled 2021-07-25: qty 1

## 2021-07-25 MED ORDER — ACETAMINOPHEN 10 MG/ML IV SOLN
INTRAVENOUS | Status: DC | PRN
  Administered 2021-07-25: 1000 mg via INTRAVENOUS

## 2021-07-25 MED ORDER — LACTATED RINGERS IV SOLN
INTRAVENOUS | Status: DC
Start: 2021-07-25 — End: 2021-07-25

## 2021-07-25 MED ORDER — THROMBIN 20000 UNITS EX SOLR
CUTANEOUS | Status: DC | PRN
  Administered 2021-07-25: 20000 [IU] via TOPICAL

## 2021-07-25 MED ORDER — METHOCARBAMOL 750 MG PO TABS: 750.0000 mg | ORAL_TABLET | Freq: Four times a day (QID) | ORAL | 0 refills | Status: AC | PRN

## 2021-07-25 MED ORDER — AMISULPRIDE (ANTIEMETIC) 5 MG/2ML IV SOLN: 10.0000 mg | Freq: Once | INTRAVENOUS | Status: DC | PRN

## 2021-07-25 MED ORDER — CHLORHEXIDINE GLUCONATE 0.12 % MT SOLN
15.0000 mL | Freq: Once | OROMUCOSAL | Status: AC
  Administered 2021-07-25: 15 mL via OROMUCOSAL
  Filled 2021-07-25: qty 15

## 2021-07-25 MED ORDER — PROMETHAZINE HCL 25 MG/ML IJ SOLN: 6.2500 mg | INTRAMUSCULAR | Status: DC | PRN

## 2021-07-25 MED ORDER — OXYCODONE HCL 5 MG PO TABS
5.0000 mg | ORAL_TABLET | Freq: Once | ORAL | Status: AC | PRN
  Administered 2021-07-25: 5 mg via ORAL

## 2021-07-25 MED ORDER — 0.9 % SODIUM CHLORIDE (POUR BTL) OPTIME
TOPICAL | Status: DC | PRN
  Administered 2021-07-25: 1000 mL

## 2021-07-25 MED ORDER — PROPOFOL 10 MG/ML IV BOLUS
INTRAVENOUS | Status: AC
  Filled 2021-07-25: qty 20

## 2021-07-25 MED ORDER — HYDROMORPHONE HCL 1 MG/ML IJ SOLN
INTRAMUSCULAR | Status: AC
  Filled 2021-07-25: qty 1

## 2021-07-25 MED ORDER — ORAL CARE MOUTH RINSE: 15.0000 mL | Freq: Once | OROMUCOSAL | Status: AC

## 2021-07-25 MED ORDER — MEPERIDINE HCL 25 MG/ML IJ SOLN: 6.2500 mg | INTRAMUSCULAR | Status: DC | PRN

## 2021-07-25 MED ORDER — LIDOCAINE 2% (20 MG/ML) 5 ML SYRINGE
INTRAMUSCULAR | Status: DC | PRN
  Administered 2021-07-25: 80 mg via INTRAVENOUS

## 2021-07-25 MED ORDER — THROMBIN (RECOMBINANT) 20000 UNITS EX SOLR
CUTANEOUS | Status: AC
  Filled 2021-07-25: qty 20000

## 2021-07-25 MED ORDER — ROCURONIUM BROMIDE 10 MG/ML (PF) SYRINGE
PREFILLED_SYRINGE | INTRAVENOUS | Status: AC
  Filled 2021-07-25: qty 10

## 2021-07-25 MED ORDER — ONDANSETRON HCL 4 MG/2ML IJ SOLN
INTRAMUSCULAR | Status: AC
  Filled 2021-07-25: qty 2

## 2021-07-25 MED ORDER — CEFAZOLIN SODIUM-DEXTROSE 2-4 GM/100ML-% IV SOLN
2.0000 g | INTRAVENOUS | Status: AC
  Administered 2021-07-25: 2 g via INTRAVENOUS

## 2021-07-25 MED ORDER — BUPIVACAINE-EPINEPHRINE 0.25% -1:200000 IJ SOLN
INTRAMUSCULAR | Status: DC | PRN
  Administered 2021-07-25: 5 mL

## 2021-07-25 MED ORDER — ACETAMINOPHEN 10 MG/ML IV SOLN
INTRAVENOUS | Status: AC
  Filled 2021-07-25: qty 100

## 2021-07-25 MED ORDER — HYDROMORPHONE HCL 1 MG/ML IJ SOLN
0.2500 mg | INTRAMUSCULAR | Status: DC | PRN
  Administered 2021-07-25 (×2): 0.5 mg via INTRAVENOUS

## 2021-07-25 MED ORDER — CEFAZOLIN SODIUM-DEXTROSE 2-4 GM/100ML-% IV SOLN
INTRAVENOUS | Status: AC
  Filled 2021-07-25: qty 100

## 2021-07-25 MED ORDER — MIDAZOLAM HCL 2 MG/2ML IJ SOLN
INTRAMUSCULAR | Status: AC
  Filled 2021-07-25: qty 2

## 2021-07-25 MED ORDER — HYDROCODONE-ACETAMINOPHEN 5-325 MG PO TABS: 1.0000 | ORAL_TABLET | Freq: Four times a day (QID) | ORAL | 0 refills | Status: AC | PRN

## 2021-07-25 MED ORDER — MIDAZOLAM HCL 2 MG/2ML IJ SOLN
INTRAMUSCULAR | Status: DC | PRN
Start: 2021-07-25 — End: 2021-07-25
  Administered 2021-07-25: 2 mg via INTRAVENOUS

## 2021-07-25 MED ORDER — OXYCODONE HCL 5 MG/5ML PO SOLN: 5.0000 mg | Freq: Once | ORAL | Status: AC | PRN

## 2021-07-25 MED ORDER — FENTANYL CITRATE (PF) 250 MCG/5ML IJ SOLN
INTRAMUSCULAR | Status: AC
  Filled 2021-07-25: qty 5

## 2021-07-25 MED ORDER — PROPOFOL 10 MG/ML IV BOLUS
INTRAVENOUS | Status: DC | PRN
  Administered 2021-07-25: 200 mg via INTRAVENOUS

## 2021-07-25 MED ORDER — POVIDONE-IODINE 7.5 % EX SOLN
Freq: Once | CUTANEOUS | Status: AC
  Filled 2021-07-25: qty 118

## 2021-07-25 MED ORDER — FENTANYL CITRATE (PF) 250 MCG/5ML IJ SOLN
INTRAMUSCULAR | Status: DC | PRN
  Administered 2021-07-25: 50 ug via INTRAVENOUS
  Administered 2021-07-25: 100 ug via INTRAVENOUS

## 2021-07-25 MED ORDER — SUGAMMADEX SODIUM 200 MG/2ML IV SOLN
INTRAVENOUS | Status: DC | PRN
  Administered 2021-07-25 (×2): 100 mg via INTRAVENOUS

## 2021-07-25 MED ORDER — ALBUMIN HUMAN 5 % IV SOLN: INTRAVENOUS | Status: DC | PRN

## 2021-07-25 MED ORDER — LIDOCAINE 2% (20 MG/ML) 5 ML SYRINGE
INTRAMUSCULAR | Status: AC
  Filled 2021-07-25: qty 5

## 2021-07-25 MED ORDER — BUPIVACAINE-EPINEPHRINE (PF) 0.25% -1:200000 IJ SOLN
INTRAMUSCULAR | Status: AC
  Filled 2021-07-25: qty 30

## 2021-07-25 SURGICAL SUPPLY — 79 items
AGENT HMST KT MTR STRL THRMB (HEMOSTASIS)
APL SKNCLS STERI-STRIP NONHPOA (GAUZE/BANDAGES/DRESSINGS) ×1
BAG COUNTER SPONGE SURGICOUNT (BAG) ×2 IMPLANT
BAG SPNG CNTER NS LX DISP (BAG) ×1
BENZOIN TINCTURE PRP APPL 2/3 (GAUZE/BANDAGES/DRESSINGS) ×2 IMPLANT
BIT DRILL NEURO 2X3.1 SFT TUCH (MISCELLANEOUS) ×1 IMPLANT
BIT DRILL SRG 14X2.2XFLT CHK (BIT) IMPLANT
BIT DRL SRG 14X2.2XFLT CHK (BIT) ×1
BLADE CLIPPER SURG (BLADE) ×2 IMPLANT
BLADE SURG 15 STRL LF DISP TIS (BLADE) ×1 IMPLANT
BLADE SURG 15 STRL SS (BLADE) ×2
BONE CERV LORDOTIC 14.5X12X7 (Bone Implant) ×2 IMPLANT
CARTRIDGE OIL MAESTRO DRILL (MISCELLANEOUS) ×1 IMPLANT
CORD BIPOLAR FORCEPS 12FT (ELECTRODE) ×2 IMPLANT
COVER SURGICAL LIGHT HANDLE (MISCELLANEOUS) ×2 IMPLANT
DIFFUSER DRILL AIR PNEUMATIC (MISCELLANEOUS) ×2 IMPLANT
DRAIN JACKSON RD 7FR 3/32 (WOUND CARE) IMPLANT
DRAPE C-ARM 35X43 STRL (DRAPES) ×1 IMPLANT
DRAPE C-ARM 42X72 X-RAY (DRAPES) ×2 IMPLANT
DRAPE POUCH INSTRU U-SHP 10X18 (DRAPES) ×3 IMPLANT
DRAPE SURG 17X23 STRL (DRAPES) ×6 IMPLANT
DRILL BIT SKYLINE 14MM (BIT) ×2
DRILL NEURO 2X3.1 SOFT TOUCH (MISCELLANEOUS) ×2
DURAPREP 26ML APPLICATOR (WOUND CARE) ×2 IMPLANT
ELECT COATED BLADE 2.86 ST (ELECTRODE) ×2 IMPLANT
ELECT REM PT RETURN 9FT ADLT (ELECTROSURGICAL) ×2
ELECTRODE REM PT RTRN 9FT ADLT (ELECTROSURGICAL) ×1 IMPLANT
EVACUATOR SILICONE 100CC (DRAIN) IMPLANT
GAUZE 4X4 16PLY ~~LOC~~+RFID DBL (SPONGE) ×2 IMPLANT
GAUZE SPONGE 4X4 12PLY STRL (GAUZE/BANDAGES/DRESSINGS) ×2 IMPLANT
GLOVE BIO SURGEON STRL SZ7 (GLOVE) ×2 IMPLANT
GLOVE BIO SURGEON STRL SZ8 (GLOVE) ×2 IMPLANT
GLOVE BIOGEL PI IND STRL 7.0 (GLOVE) ×2 IMPLANT
GLOVE BIOGEL PI IND STRL 8 (GLOVE) ×1 IMPLANT
GLOVE BIOGEL PI INDICATOR 7.0 (GLOVE) ×2
GLOVE BIOGEL PI INDICATOR 8 (GLOVE) ×1
GLOVE SURG ENC MOIS LTX SZ6.5 (GLOVE) ×2 IMPLANT
GOWN STRL REUS W/ TWL LRG LVL3 (GOWN DISPOSABLE) ×1 IMPLANT
GOWN STRL REUS W/ TWL XL LVL3 (GOWN DISPOSABLE) ×1 IMPLANT
GOWN STRL REUS W/TWL LRG LVL3 (GOWN DISPOSABLE) ×2
GOWN STRL REUS W/TWL XL LVL3 (GOWN DISPOSABLE) ×2
GRAFT BNE SPCR VG2 14.5X12X7 (Bone Implant) IMPLANT
IV CATH 14GX2 1/4 (CATHETERS) ×2 IMPLANT
KIT BASIN OR (CUSTOM PROCEDURE TRAY) ×2 IMPLANT
KIT TURNOVER KIT B (KITS) ×2 IMPLANT
MANIFOLD NEPTUNE II (INSTRUMENTS) ×2 IMPLANT
MARKER SKIN DUAL TIP RULER LAB (MISCELLANEOUS) ×1 IMPLANT
NDL PRECISIONGLIDE 27X1.5 (NEEDLE) ×1 IMPLANT
NDL SPNL 20GX3.5 QUINCKE YW (NEEDLE) ×1 IMPLANT
NEEDLE PRECISIONGLIDE 27X1.5 (NEEDLE) ×2 IMPLANT
NEEDLE SPNL 20GX3.5 QUINCKE YW (NEEDLE) ×2 IMPLANT
NS IRRIG 1000ML POUR BTL (IV SOLUTION) ×2 IMPLANT
OIL CARTRIDGE MAESTRO DRILL (MISCELLANEOUS) ×2
PACK ORTHO CERVICAL (CUSTOM PROCEDURE TRAY) ×2 IMPLANT
PAD ARMBOARD 7.5X6 YLW CONV (MISCELLANEOUS) ×4 IMPLANT
PATTIES SURGICAL .5 X.5 (GAUZE/BANDAGES/DRESSINGS) IMPLANT
PATTIES SURGICAL .5 X1 (DISPOSABLE) IMPLANT
PIN DISTRACTION 14 (PIN) ×2 IMPLANT
PLATE SKYLINE 12MM (Plate) ×1 IMPLANT
POSITIONER HEAD DONUT 9IN (MISCELLANEOUS) ×2 IMPLANT
SCREW SKYLINE VAR OS 14MM (Screw) ×4 IMPLANT
SOL ANTI FOG 6CC (MISCELLANEOUS) IMPLANT
SOLUTION ANTI FOG 6CC (MISCELLANEOUS) ×1
SPONGE INTESTINAL PEANUT (DISPOSABLE) ×2 IMPLANT
SPONGE SURGIFOAM ABS GEL 100 (HEMOSTASIS) IMPLANT
STRIP CLOSURE SKIN 1/2X4 (GAUZE/BANDAGES/DRESSINGS) ×2 IMPLANT
SURGIFLO W/THROMBIN 8M KIT (HEMOSTASIS) IMPLANT
SUT MNCRL AB 4-0 PS2 18 (SUTURE) ×2 IMPLANT
SUT SILK 4 0 (SUTURE)
SUT SILK 4-0 18XBRD TIE 12 (SUTURE) IMPLANT
SUT VIC AB 2-0 CT2 18 VCP726D (SUTURE) ×2 IMPLANT
SYR BULB IRRIG 60ML STRL (SYRINGE) ×2 IMPLANT
SYR CONTROL 10ML LL (SYRINGE) ×4 IMPLANT
TAPE CLOTH 4X10 WHT NS (GAUZE/BANDAGES/DRESSINGS) ×2 IMPLANT
TAPE UMBILICAL COTTON 1/8X30 (MISCELLANEOUS) ×2 IMPLANT
TOWEL GREEN STERILE (TOWEL DISPOSABLE) ×2 IMPLANT
TOWEL GREEN STERILE FF (TOWEL DISPOSABLE) ×2 IMPLANT
WATER STERILE IRR 1000ML POUR (IV SOLUTION) ×2 IMPLANT
YANKAUER SUCT BULB TIP NO VENT (SUCTIONS) ×2 IMPLANT

## 2021-07-25 NOTE — Transfer of Care (Signed)
Immediate Anesthesia Transfer of Care Note ? ?Patient: Allen Cole ? ?Procedure(s) Performed: ANTERIOR CERVICAL DECOMPRESSION FUSION CERVICAL 6- CERVICAL 7 WITH INSTRUMENTATION AND ALLOGRAFT ? ?Patient Location: PACU ? ?Anesthesia Type:General ? ?Level of Consciousness: awake, alert  and oriented ? ?Airway & Oxygen Therapy: Patient Spontanous Breathing and Patient connected to nasal cannula oxygen ? ?Post-op Assessment: Report given to RN and Post -op Vital signs reviewed and stable ? ?Post vital signs: Reviewed and stable ? ?Last Vitals:  ?Vitals Value Taken Time  ?BP 143/88 07/25/21 1600  ?Temp    ?Pulse 76 07/25/21 1603  ?Resp 15 07/25/21 1603  ?SpO2 94 % 07/25/21 1603  ?Vitals shown include unvalidated device data. ? ?Last Pain:  ?Vitals:  ? 07/25/21 0951  ?TempSrc:   ?PainSc: 8   ?   ? ?Patients Stated Pain Goal: 0 (07/25/21 0951) ? ?Complications: No notable events documented. ?

## 2021-07-25 NOTE — Anesthesia Procedure Notes (Addendum)
Procedure Name: Intubation ?Date/Time: 07/25/2021 2:00 PM ?Performed by: Michele Rockers, CRNA ?Pre-anesthesia Checklist: Patient identified, Patient being monitored, Timeout performed, Emergency Drugs available and Suction available ?Patient Re-evaluated:Patient Re-evaluated prior to induction ?Oxygen Delivery Method: Circle System Utilized ?Preoxygenation: Pre-oxygenation with 100% oxygen ?Induction Type: IV induction ?Ventilation: Mask ventilation without difficulty and Oral airway inserted - appropriate to patient size ?Laryngoscope Size: Sabra Heck and 2 ?Grade View: Grade II ?Tube type: Oral ?Tube size: 7.5 mm ?Number of attempts: 1 ?Airway Equipment and Method: Stylet ?Placement Confirmation: ETT inserted through vocal cords under direct vision, positive ETCO2 and breath sounds checked- equal and bilateral ?Secured at: 22 cm ?Tube secured with: Tape ?Dental Injury: Teeth and Oropharynx as per pre-operative assessment  ? ? ? ? ?

## 2021-07-25 NOTE — Progress Notes (Signed)
Patient ambulated around PACU twice, gait steady and pt tolerated well. Patient able to swallow and tolerate PO.  ?

## 2021-07-25 NOTE — H&P (Signed)
? ? ? ?PREOPERATIVE H&P ? ?Chief Complaint: Left arm pain ? ?HPI: ?Allen Cole is a 61 y.o. male who presents with ongoing pain in the left arm ? 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 ?Patient has failed multiple forms of conservative care and continues to have pain (see office notes for additional details regarding the patient's full course of treatment) ? ?Past Medical History:  ?Diagnosis Date  ? Depression   ? Hypertension   ? Sleep apnea   ? ?No past surgical history on file. ?Social History  ? ?Socioeconomic History  ? Marital status: Divorced  ?  Spouse name: Not on file  ? Number of children: Not on file  ? Years of education: Not on file  ? Highest education level: Not on file  ?Occupational History  ? Not on file  ?Tobacco Use  ? Smoking status: Never  ? Smokeless tobacco: Never  ?Vaping Use  ? Vaping Use: Never used  ?Substance and Sexual Activity  ? Alcohol use: Yes  ? Drug use: No  ? Sexual activity: Not on file  ?Other Topics Concern  ? Not on file  ?Social History Narrative  ? Not on file  ? ?Social Determinants of Health  ? ?Financial Resource Strain: Not on file  ?Food Insecurity: Not on file  ?Transportation Needs: Not on file  ?Physical Activity: Not on file  ?Stress: Not on file  ?Social Connections: Not on file  ? ?Family History  ?Problem Relation Age of Onset  ? Diabetes Mother   ? Diabetes Maternal Grandmother   ? Hyperlipidemia Maternal Grandfather   ? Stroke Maternal Grandfather   ? ?No Known Allergies ?Prior to Admission medications   ?Medication Sig Start Date End Date Taking? Authorizing Provider  ?azelastine (ASTELIN) 0.1 % nasal spray Place 1 spray into both nostrils 2 (two) times daily. Use in each nostril as directed   Yes [provider]  ?betamethasone dipropionate 0.05 % cream Apply 1 application. topically daily as needed (irritation).   Yes [provider]  ?buPROPion (WELLBUTRIN XL) 300 MG 24 hr tablet Take 300 mg by mouth daily.    Yes [provider]  ?cetirizine (ZYRTEC) 10 MG tablet Take 10 mg by mouth daily.   Yes [provider]  ?fluticasone (FLONASE) 50 MCG/ACT nasal spray Place 1 spray into both nostrils daily.   Yes [provider]  ?fluticasone (FLOVENT HFA) 220 MCG/ACT inhaler Inhale 1 puff into the lungs 2 (two) times daily.   Yes [provider]  ?gabapentin (NEURONTIN) 300 MG capsule Take 300 mg by mouth in the morning, at noon, and at bedtime. 05/22/21  Yes [provider]  ?losartan-hydrochlorothiazide (HYZAAR) 100-25 MG tablet Take 1 tablet by mouth daily.   Yes [provider]  ?melatonin 3 MG TABS tablet Take 6 mg by mouth at bedtime.   Yes [provider]  ?montelukast (SINGULAIR) 10 MG tablet Take 10 mg by mouth at bedtime.   Yes [provider]  ?pantoprazole (PROTONIX) 40 MG tablet Take 40 mg by mouth daily.   Yes [provider]  ?Polyethyl Glycol-Propyl Glycol (SYSTANE OP) Place 1 drop into both eyes daily as needed (dry eyes).   Yes [provider]  ?prazosin (MINIPRESS) 1 MG capsule Take 1 mg by mouth at bedtime.   Yes [provider]  ?QUEtiapine (SEROQUEL) 300 MG tablet Take 300 mg by mouth at bedtime.   Yes [provider]  ?venlafaxine  XR (EFFEXOR-XR) 150 MG 24 hr capsule Take 150 mg by mouth daily with breakfast.   Yes [provider]  ?cefdinir (OMNICEF) 300 MG capsule Take 2 capsules (600 mg total) by mouth daily. ?Patient not taking: Reported on 07/13/2021 05/23/16   Jaynee Eagles, PA-C  ?tobramycin (TOBREX) 0.3 % ophthalmic ointment Place 1 application into the right eye 3 (three) times daily. ?Patient not taking: Reported on 07/13/2021 05/25/16   Mancel Bale, PA-C  ? ? ? ?All other systems have been reviewed and were otherwise negative with the exception of those mentioned in the HPI and as above. ? ?Physical Exam: ?There were no vitals filed for this visit. ? ?There is no height or weight on  file to calculate BMI. ? ?General: Alert, no acute distress ?Cardiovascular: No pedal edema ?Respiratory: No cyanosis, no use of accessory musculature ?Skin: No lesions in the area of chief complaint ?Neurologic: Sensation intact distally ?Psychiatric: Patient is competent for consent with normal mood and affect ?Lymphatic: No axillary or cervical lymphadenopathy ? ? ?Assessment/Plan: ?Ongoing left-sided C7 radiculopathy, secondary to severe stenosis on the left at C6-7 ?Plan for Procedure(s): ?ANTERIOR CERVICAL DECOMPRESSION FUSION CERVICAL 6- CERVICAL 7 WITH INSTRUMENTATION AND ALLOGRAFT ? ? ?Norva Karvonen, MD ?07/25/2021 ?7:20 AM  ?

## 2021-07-25 NOTE — Anesthesia Preprocedure Evaluation (Signed)
Anesthesia Evaluation  ?Patient identified by MRN, date of birth, ID band ?Patient awake ? ? ? ?Reviewed: ?Allergy & Precautions, NPO status , Patient's Chart, lab work & pertinent test results ? ?Airway ?Mallampati: II ? ?TM Distance: >3 FB ?Neck ROM: Full ? ? ? Dental ?no notable dental hx. ? ?  ?Pulmonary ?sleep apnea ,  ?  ?Pulmonary exam normal ?breath sounds clear to auscultation ? ? ? ? ? ? Cardiovascular ?hypertension, Pt. on medications ?negative cardio ROS ?Normal cardiovascular exam ?Rhythm:Regular Rate:Normal ? ? ?  ?Neuro/Psych ?Depression negative neurological ROS ? negative psych ROS  ? GI/Hepatic ?negative GI ROS, Neg liver ROS,   ?Endo/Other  ?negative endocrine ROS ? Renal/GU ?negative Renal ROS  ?negative genitourinary ?  ?Musculoskeletal ?negative musculoskeletal ROS ?(+)  ? Abdominal ?  ?Peds ?negative pediatric ROS ?(+)  Hematology ?negative hematology ROS ?(+)   ?Anesthesia Other Findings ? ? Reproductive/Obstetrics ?negative OB ROS ? ?  ? ? ? ? ? ? ? ? ? ? ? ? ? ?  ?  ? ? ? ? ? ? ? ? ?Anesthesia Physical ?Anesthesia Plan ? ?ASA: 2 ? ?Anesthesia Plan: General  ? ?Post-op Pain Management: Dilaudid IV and Ofirmev IV (intra-op)*  ? ?Induction: Intravenous ? ?PONV Risk Score and Plan: 2 and Ondansetron, Midazolam and Treatment may vary due to age or medical condition ? ?Airway Management Planned: Oral ETT and Video Laryngoscope Planned ? ?Additional Equipment:  ? ?Intra-op Plan:  ? ?Post-operative Plan: Extubation in OR ? ?Informed Consent: I have reviewed the patients History and Physical, chart, labs and discussed the procedure including the risks, benefits and alternatives for the proposed anesthesia with the patient or authorized representative who has indicated his/her understanding and acceptance.  ? ? ? ?Dental advisory given ? ?Plan Discussed with: CRNA ? ?Anesthesia Plan Comments:   ? ? ? ? ? ? ?Anesthesia Quick Evaluation ? ?

## 2021-07-25 NOTE — Op Note (Signed)
PATIENT NAME: Allen Cole  ? ?MEDICAL RECORD NO.:   [id number]  ?  ?DATE OF BIRTH: Apr 03, 1960 ?  ?DATE OF PROCEDURE: 07/25/2021  ?  ?                            OPERATIVE REPORT ?  ?  ?PREOPERATIVE DIAGNOSES: ?1. Left-sided cervical radiculopathy. ?2. Spinal stenosis spanning C6/7. ?  ?POSTOPERATIVE DIAGNOSES: ?1. Left-sided cervical radiculopathy. ?2. Spinal stenosis spanning C6/7. ?  ?PROCEDURE: ?1. Anterior cervical decompression and fusion C6/7. ?2. Placement of anterior instrumentation, C6/7. ?3. Insertion structural allograft (55m VG-2 allograft). ?4. Intraoperative use of fluoroscopy. ?  ?SURGEON:  MPhylliss Bob MD ?  ?ASSISTANT:  KPricilla Holm PA-C. ?  ?ANESTHESIA:  General endotracheal anesthesia. ?  ?COMPLICATIONS:  None. ?  ?DISPOSITION:  Stable. ?  ?ESTIMATED BLOOD LOSS:  Minimal. ?  ?INDICATIONS FOR SURGERY:  Briefly, Allen Cole a pleasant 55-year- ?old male, who did present to me with severe pain in his neck and left arm.   ?The patient's MRI did reveal the findings noted above.  Given the ?ongoing rather debilitating pain and lack of improvement with ?appropriate treatment measures, we did discuss proceeding with the ?procedure noted above.  The patient was fully aware of the risks and ?limitations of surgery as outlined in my preoperative note. ?  ?OPERATIVE DETAILS:  On 07/25/2021, the patient was brought to ?surgery and general endotracheal anesthesia was administered.  The ?patient was placed supine on the hospital bed. The neck was gently ?extended.  All bony prominences were meticulously padded.  The neck was ?prepped and draped in the usual sterile fashion.  At this point, I did ?make a left-sided transverse incision.  The platysma was incised.  A ?Smith-Robinson approach was used and the anterior spine was identified. ?A self-retaining retractor was placed.  I then subperiosteally exposed ?the vertebral bodies from C6-C7.  Caspar pins were then placed into the ?C6 and C7 vertebral  bodies and distraction was applied.  A thorough and ?complete C6-7 intervertebral diskectomy was performed.  The posterior ?longitudinal ligament was identified and entered using a nerve hook.  I ?then used #1 followed by #2 Kerrison to perform a thorough and complete ?intervertebral diskectomy.  The spinal canal was thoroughly ?decompressed, as was the left neuroforamen.  The endplates were then ?prepared and the appropriate-sized structural allograft (717m was then inserted in the usual fashion. An excellent press fit was noted. The Caspar pins  ?then were removed and bone wax was placed in their place.  The appropriate-sized anterior cervical plate was placed over the anterior spine.  14 mm variable angle screws were placed, 2 in each vertebral body from C6-C7 ?for a total of 4 vertebral body screws.  The screws were then locked to ?the plate using the Cam locking mechanism.  I was very pleased with the ?final fluoroscopic images.  The wound was then irrigated.  The wound was ?then explored for any undue bleeding and there was no bleeding noted. ?The wound was then closed in layers using 2-0 Vicryl, followed by 4-0 ?Monocryl.  Benzoin and Steri-Strips were applied, followed by sterile ?dressing.  All instrument counts were correct at the termination of the ?procedure. ?  ?Of note, KaPricilla HolmPA-C, was my assistant throughout surgery, and ?did aid in retraction, placement of the hardware, suctioning, and closure from start to finish. ?  ?  ?  ?MaPhylliss BobMD  ?

## 2021-07-26 MED FILL — Thrombin (Recombinant) For Soln 20000 Unit: CUTANEOUS | Qty: 1 | Status: AC

## 2021-07-29 NOTE — Anesthesia Postprocedure Evaluation (Signed)
Anesthesia Post Note  Patient: Allen Cole  Procedure(s) Performed: ANTERIOR CERVICAL DECOMPRESSION FUSION CERVICAL 6- CERVICAL 7 WITH INSTRUMENTATION AND ALLOGRAFT     Patient location during evaluation: PACU Anesthesia Type: General Level of consciousness: awake and sedated Pain management: pain level controlled Vital Signs Assessment: post-procedure vital signs reviewed and stable Respiratory status: spontaneous breathing Cardiovascular status: stable Postop Assessment: no apparent nausea or vomiting, no headache and no backache Anesthetic complications: no   No notable events documented.  Last Vitals:  Vitals:   07/25/21 1615 07/25/21 1630  BP: 131/85 134/88  Pulse: 73 74  Resp: 10 10  Temp:  36.6 C  SpO2: 96% 95%    Last Pain:  Vitals:   07/25/21 1630  TempSrc:   PainSc: 4    Pain Goal: Patients Stated Pain Goal: 0 (07/25/21 0951)                 Huston Foley

## 2021-07-30 ENCOUNTER — Encounter (HOSPITAL_COMMUNITY): Payer: Self-pay | Admitting: Orthopedic Surgery
